# Patient Record
Sex: Male | Born: 1970
Health system: Southern US, Community
[De-identification: ages and names within clinical notes are randomized; demographics above are authoritative.]

## PROBLEM LIST (undated history)

## (undated) DIAGNOSIS — N200 Calculus of kidney: Secondary | ICD-10-CM

## (undated) DIAGNOSIS — E119 Type 2 diabetes mellitus without complications: Secondary | ICD-10-CM

## (undated) HISTORY — DX: Type 2 diabetes mellitus without complications: E11.9

---

## 2007-12-31 ENCOUNTER — Ambulatory Visit (HOSPITAL_COMMUNITY): Admission: RE | Admit: 2007-12-31 | Discharge: 2007-12-31 | Payer: Self-pay | Admitting: Gastroenterology

## 2010-11-25 ENCOUNTER — Emergency Department (HOSPITAL_COMMUNITY)
Admission: EM | Admit: 2010-11-25 | Discharge: 2010-11-25 | Disposition: A | Discharge status: Home / Self Care | Payer: Self-pay | Attending: Emergency Medicine | Admitting: Emergency Medicine

## 2010-11-25 ENCOUNTER — Emergency Department (HOSPITAL_COMMUNITY): Payer: Self-pay

## 2010-11-25 DIAGNOSIS — N200 Calculus of kidney: Secondary | ICD-10-CM | POA: Insufficient documentation

## 2010-11-25 DIAGNOSIS — R1032 Left lower quadrant pain: Secondary | ICD-10-CM | POA: Insufficient documentation

## 2010-11-25 DIAGNOSIS — R109 Unspecified abdominal pain: Secondary | ICD-10-CM | POA: Insufficient documentation

## 2010-11-25 DIAGNOSIS — R112 Nausea with vomiting, unspecified: Secondary | ICD-10-CM | POA: Insufficient documentation

## 2010-11-25 LAB — URINE MICROSCOPIC-ADD ON

## 2010-11-25 LAB — URINALYSIS, ROUTINE W REFLEX MICROSCOPIC
Bilirubin Urine: NEGATIVE
Nitrite: NEGATIVE
Specific Gravity, Urine: 1.023 (ref 1.005–1.030)
pH: 5 (ref 5.0–8.0)

## 2012-02-04 ENCOUNTER — Emergency Department (HOSPITAL_COMMUNITY)
Admission: EM | Admit: 2012-02-04 | Discharge: 2012-02-05 | Disposition: A | Discharge status: Home / Self Care | Payer: Self-pay | Attending: Emergency Medicine | Admitting: Emergency Medicine

## 2012-02-04 DIAGNOSIS — N200 Calculus of kidney: Secondary | ICD-10-CM | POA: Insufficient documentation

## 2012-02-04 LAB — POCT I-STAT, CHEM 8
BUN: 14 mg/dL (ref 6–23)
Chloride: 106 mEq/L (ref 96–112)
Creatinine, Ser: 1.4 mg/dL — ABNORMAL HIGH (ref 0.50–1.35)
Hemoglobin: 17 g/dL (ref 13.0–17.0)
Potassium: 3.4 mEq/L — ABNORMAL LOW (ref 3.5–5.1)
Sodium: 142 mEq/L (ref 135–145)

## 2012-02-04 LAB — URINALYSIS, ROUTINE W REFLEX MICROSCOPIC
Bilirubin Urine: NEGATIVE
pH: 5.5 (ref 5.0–8.0)

## 2012-02-04 LAB — CBC WITH DIFFERENTIAL/PLATELET
Basophils Absolute: 0 10*3/uL (ref 0.0–0.1)
Basophils Relative: 0 % (ref 0–1)
Eosinophils Absolute: 0 10*3/uL (ref 0.0–0.7)
Eosinophils Relative: 1 % (ref 0–5)
Lymphocytes Relative: 14 % (ref 12–46)
MCHC: 34.5 g/dL (ref 30.0–36.0)
MCV: 79 fL (ref 78.0–100.0)
Monocytes Absolute: 0.5 10*3/uL (ref 0.1–1.0)
Platelets: 180 10*3/uL (ref 150–400)
RDW: 13.9 % (ref 11.5–15.5)
WBC: 8.4 10*3/uL (ref 4.0–10.5)

## 2012-02-04 LAB — URINE MICROSCOPIC-ADD ON

## 2012-02-04 MED ORDER — ONDANSETRON HCL 4 MG/2ML IJ SOLN
4.0000 mg | Freq: Once | INTRAMUSCULAR | Status: AC
  Administered 2012-02-04: 4 mg via INTRAVENOUS
  Filled 2012-02-04: qty 2

## 2012-02-04 MED ORDER — TAMSULOSIN HCL 0.4 MG PO CAPS: 0.4000 mg | ORAL_CAPSULE | Freq: Every day | ORAL | Status: DC

## 2012-02-04 MED ORDER — SODIUM CHLORIDE 0.9 % IV BOLUS (SEPSIS)
1000.0000 mL | Freq: Once | INTRAVENOUS | Status: AC
  Administered 2012-02-04: 1000 mL via INTRAVENOUS

## 2012-02-04 MED ORDER — HYDROMORPHONE HCL PF 1 MG/ML IJ SOLN
1.0000 mg | Freq: Once | INTRAMUSCULAR | Status: AC
  Administered 2012-02-04: 1 mg via INTRAVENOUS
  Filled 2012-02-04: qty 1

## 2012-02-04 MED ORDER — OXYCODONE-ACETAMINOPHEN 5-325 MG PO TABS
1.0000 | ORAL_TABLET | Freq: Four times a day (QID) | ORAL | Status: DC | PRN
Start: 2012-02-04 — End: 2015-05-12

## 2012-02-04 MED ORDER — OXYCODONE-ACETAMINOPHEN 5-325 MG PO TABS
2.0000 | ORAL_TABLET | Freq: Once | ORAL | Status: AC
  Administered 2012-02-05: 2 via ORAL
  Filled 2012-02-04: qty 2

## 2012-02-04 NOTE — ED Notes (Signed)
Pt reports hx kidney stone in July.  Pt n/v since 7pm.  Pt present with right side abd pain.  Pt denies chills or fever

## 2012-02-04 NOTE — ED Provider Notes (Addendum)
History     CSN: [id number]  Arrival date & time 02/04/12  2022   First MD Initiated Contact with Patient 02/04/12 2158      Chief Complaint  Patient presents with  . Abdominal Pain    (Consider location/radiation/quality/duration/timing/severity/associated sxs/prior treatment) Patient is a 41 y.o. male presenting with abdominal pain. The history is provided by the patient.  Abdominal Pain The primary symptoms of the illness include abdominal pain, nausea and vomiting. The primary symptoms of the illness do not include fever, diarrhea or dysuria. The current episode started 3 to 5 hours ago. The onset of the illness was sudden. The problem has not changed since onset. The abdominal pain is located in the RLQ. The abdominal pain radiates to the right flank. The severity of the abdominal pain is 9/10. The abdominal pain is relieved by nothing. Exacerbated by: nothing.  The vomiting began today. Vomiting occurs 2 to 5 times per day. The emesis contains stomach contents.  Additional symptoms associated with the illness include anorexia. Symptoms associated with the illness do not include chills, urgency, hematuria or frequency. Associated medical issues comments: hx of kidney stone.    No past medical history on file.  No past surgical history on file.  No family history on file.  History  Substance Use Topics  . Smoking status: Not on file  . Smokeless tobacco: Not on file  . Alcohol Use: Not on file      Review of Systems  Constitutional: Negative for fever and chills.  Gastrointestinal: Positive for nausea, vomiting, abdominal pain and anorexia. Negative for diarrhea.  Genitourinary: Negative for dysuria, urgency, frequency and hematuria.  All other systems reviewed and are negative.    Allergies  Review of patient's allergies indicates no known allergies.  Home Medications   Current Outpatient Rx  Name Route Sig Dispense Refill  . PERCOCET PO Oral Take 2 tablets  by mouth once.      BP 134/83  Temp 98.2 F (36.8 C) (Oral)  Resp 14  SpO2 100%  Physical Exam  Nursing note and vitals reviewed. Constitutional: He is oriented to person, place, and time. He appears well-developed and well-nourished. No distress.       Appears uncomfortable.  HENT:  Head: Normocephalic and atraumatic.  Mouth/Throat: Oropharynx is clear and moist.  Eyes: Conjunctivae normal and EOM are normal. Pupils are equal, round, and reactive to light.  Neck: Normal range of motion. Neck supple.  Cardiovascular: Normal rate, regular rhythm and intact distal pulses.   No murmur heard. Pulmonary/Chest: Effort normal and breath sounds normal. No respiratory distress. He has no wheezes. He has no rales.  Abdominal: Soft. He exhibits no distension. There is tenderness. There is no rebound and no guarding.       Very mild tenderness over the RLQ.  No rebound or guarding  Musculoskeletal: Normal range of motion. He exhibits no edema and no tenderness.  Neurological: He is alert and oriented to person, place, and time.  Skin: Skin is warm and dry. No rash noted. No erythema.  Psychiatric: He has a normal mood and affect. His behavior is normal.    ED Course  Procedures (including critical care time)  Labs Reviewed  CBC WITH DIFFERENTIAL - Abnormal; Notable for the following:    Neutrophils Relative 80 (*)     All other components within normal limits  URINALYSIS, ROUTINE W REFLEX MICROSCOPIC - Abnormal; Notable for the following:    Hgb urine dipstick SMALL (*)  Ketones, ur TRACE (*)     All other components within normal limits  POCT I-STAT, CHEM 8 - Abnormal; Notable for the following:    Potassium 3.4 (*)     Creatinine, Ser 1.40 (*)     Glucose, Bld 141 (*)     Calcium, Ion 1.03 (*)     All other components within normal limits  URINE MICROSCOPIC-ADD ON   No results found.   1. Kidney stone       MDM   Pt with symptoms consistent with kidney stone with hx  of same.  Never required surgical removal.  Denies infectious sx, or GI symptoms.  Low concern for diverticulitis and no risk factors or history suggestive of AAA.  No hx suggestive of GU source (discharge) and otherwise pt is healthy.  Will hydrate, treat pain and ensure no infection with UA, CBC, BMP.  Pt given pain control and nausea control.  11:06 PM Improvement in pain from a 9 to 6/10.  CBC wnl.  I-stat with Cr of 1.4 without old to compare. UA with small hb but o/w unremarkable.  Will attempt to control pain.      Gwyneth Sprout, MD 02/04/12 2322  Gwyneth Sprout, MD 02/04/12 [phone]

## 2014-08-14 ENCOUNTER — Encounter (HOSPITAL_COMMUNITY): Payer: Self-pay | Admitting: Emergency Medicine

## 2014-08-14 ENCOUNTER — Emergency Department (HOSPITAL_COMMUNITY): Payer: Federal, State, Local not specified - PPO

## 2014-08-14 ENCOUNTER — Observation Stay (HOSPITAL_COMMUNITY)
Admission: EM | Admit: 2014-08-14 | Discharge: 2014-08-16 | Disposition: A | Discharge status: Home / Self Care | Payer: Federal, State, Local not specified - PPO | Attending: Internal Medicine | Admitting: Internal Medicine

## 2014-08-14 ENCOUNTER — Emergency Department (INDEPENDENT_AMBULATORY_CARE_PROVIDER_SITE_OTHER)
Admission: EM | Admit: 2014-08-14 | Discharge: 2014-08-14 | Disposition: A | Discharge status: Home / Self Care | Payer: Federal, State, Local not specified - PPO | Source: Home / Self Care | Attending: Family Medicine | Admitting: Family Medicine

## 2014-08-14 DIAGNOSIS — R824 Acetonuria: Secondary | ICD-10-CM | POA: Diagnosis not present

## 2014-08-14 DIAGNOSIS — N289 Disorder of kidney and ureter, unspecified: Secondary | ICD-10-CM

## 2014-08-14 DIAGNOSIS — R81 Glycosuria: Secondary | ICD-10-CM | POA: Diagnosis not present

## 2014-08-14 DIAGNOSIS — I129 Hypertensive chronic kidney disease with stage 1 through stage 4 chronic kidney disease, or unspecified chronic kidney disease: Secondary | ICD-10-CM | POA: Diagnosis not present

## 2014-08-14 DIAGNOSIS — R000 Tachycardia, unspecified: Secondary | ICD-10-CM | POA: Insufficient documentation

## 2014-08-14 DIAGNOSIS — R739 Hyperglycemia, unspecified: Secondary | ICD-10-CM | POA: Diagnosis present

## 2014-08-14 DIAGNOSIS — E119 Type 2 diabetes mellitus without complications: Secondary | ICD-10-CM

## 2014-08-14 DIAGNOSIS — R358 Other polyuria: Secondary | ICD-10-CM

## 2014-08-14 DIAGNOSIS — N182 Chronic kidney disease, stage 2 (mild): Secondary | ICD-10-CM | POA: Diagnosis present

## 2014-08-14 DIAGNOSIS — Z87442 Personal history of urinary calculi: Secondary | ICD-10-CM | POA: Diagnosis not present

## 2014-08-14 DIAGNOSIS — E860 Dehydration: Secondary | ICD-10-CM | POA: Insufficient documentation

## 2014-08-14 DIAGNOSIS — D751 Secondary polycythemia: Secondary | ICD-10-CM

## 2014-08-14 DIAGNOSIS — E1165 Type 2 diabetes mellitus with hyperglycemia: Principal | ICD-10-CM | POA: Insufficient documentation

## 2014-08-14 DIAGNOSIS — I10 Essential (primary) hypertension: Secondary | ICD-10-CM

## 2014-08-14 DIAGNOSIS — R3589 Other polyuria: Secondary | ICD-10-CM

## 2014-08-14 DIAGNOSIS — D45 Polycythemia vera: Secondary | ICD-10-CM | POA: Insufficient documentation

## 2014-08-14 HISTORY — DX: Calculus of kidney: N20.0

## 2014-08-14 LAB — POCT URINALYSIS DIP (DEVICE)
BILIRUBIN URINE: NEGATIVE
Glucose, UA: 500 mg/dL — AB
HGB URINE DIPSTICK: NEGATIVE
KETONES UR: 40 mg/dL — AB
LEUKOCYTES UA: NEGATIVE
Nitrite: NEGATIVE
Protein, ur: NEGATIVE mg/dL
SPECIFIC GRAVITY, URINE: 1.01 (ref 1.005–1.030)
Urobilinogen, UA: 0.2 mg/dL (ref 0.0–1.0)
pH: 5 (ref 5.0–8.0)

## 2014-08-14 LAB — CBC WITH DIFFERENTIAL/PLATELET
BASOS ABS: 0 10*3/uL (ref 0.0–0.1)
Basophils Relative: 0 % (ref 0–1)
Eosinophils Absolute: 0 10*3/uL (ref 0.0–0.7)
Eosinophils Relative: 1 % (ref 0–5)
HEMATOCRIT: 46.6 % (ref 39.0–52.0)
Hemoglobin: 16.1 g/dL (ref 13.0–17.0)
LYMPHS ABS: 1.3 10*3/uL (ref 0.7–4.0)
LYMPHS PCT: 25 % (ref 12–46)
MCH: 26.6 pg (ref 26.0–34.0)
MCHC: 34.5 g/dL (ref 30.0–36.0)
MCV: 77 fL — ABNORMAL LOW (ref 78.0–100.0)
MONOS PCT: 6 % (ref 3–12)
Monocytes Absolute: 0.3 10*3/uL (ref 0.1–1.0)
Neutro Abs: 3.5 10*3/uL (ref 1.7–7.7)
Neutrophils Relative %: 68 % (ref 43–77)
PLATELETS: 183 10*3/uL (ref 150–400)
RBC: 6.05 MIL/uL — ABNORMAL HIGH (ref 4.22–5.81)
RDW: 12.9 % (ref 11.5–15.5)
WBC: 5.2 10*3/uL (ref 4.0–10.5)

## 2014-08-14 LAB — COMPREHENSIVE METABOLIC PANEL
ALBUMIN: 4.3 g/dL (ref 3.5–5.2)
ALK PHOS: 115 U/L (ref 39–117)
ALT: 42 U/L (ref 0–53)
AST: 20 U/L (ref 0–37)
Anion gap: 17 — ABNORMAL HIGH (ref 5–15)
BILIRUBIN TOTAL: 0.8 mg/dL (ref 0.3–1.2)
BUN: 18 mg/dL (ref 6–23)
CHLORIDE: 97 mmol/L (ref 96–112)
CO2: 23 mmol/L (ref 19–32)
CREATININE: 1.51 mg/dL — AB (ref 0.50–1.35)
Calcium: 9 mg/dL (ref 8.4–10.5)
GFR calc Af Amer: 65 mL/min — ABNORMAL LOW (ref >90)
GFR, EST NON AFRICAN AMERICAN: 56 mL/min — AB (ref >90)
Glucose, Bld: 525 mg/dL — ABNORMAL HIGH (ref 70–99)
Potassium: 3.9 mmol/L (ref 3.5–5.1)
Sodium: 137 mmol/L (ref 135–145)
Total Protein: 8.7 g/dL — ABNORMAL HIGH (ref 6.0–8.3)

## 2014-08-14 LAB — POCT I-STAT, CHEM 8
BUN: 23 mg/dL (ref 6–23)
CALCIUM ION: 1.13 mmol/L (ref 1.12–1.23)
CHLORIDE: 99 mmol/L (ref 96–112)
Creatinine, Ser: 1.5 mg/dL — ABNORMAL HIGH (ref 0.50–1.35)
Glucose, Bld: 666 mg/dL (ref 70–99)
HCT: 55 % — ABNORMAL HIGH (ref 39.0–52.0)
HEMOGLOBIN: 18.7 g/dL — AB (ref 13.0–17.0)
Potassium: 4.7 mmol/L (ref 3.5–5.1)
SODIUM: 138 mmol/L (ref 135–145)
TCO2: 24 mmol/L (ref 0–100)

## 2014-08-14 LAB — URINALYSIS, ROUTINE W REFLEX MICROSCOPIC
Bilirubin Urine: NEGATIVE
Hgb urine dipstick: NEGATIVE
Ketones, ur: >80 mg/dL — AB
LEUKOCYTES UA: NEGATIVE
NITRITE: NEGATIVE
Protein, ur: NEGATIVE mg/dL
Specific Gravity, Urine: 1.025 (ref 1.005–1.030)
Urobilinogen, UA: 0.2 mg/dL (ref 0.0–1.0)
pH: 5.5 (ref 5.0–8.0)

## 2014-08-14 LAB — MAGNESIUM: Magnesium: 2 mg/dL (ref 1.5–2.5)

## 2014-08-14 LAB — CBG MONITORING, ED: Glucose-Capillary: 562 mg/dL (ref 70–99)

## 2014-08-14 LAB — URINE MICROSCOPIC-ADD ON

## 2014-08-14 LAB — PHOSPHORUS: PHOSPHORUS: 3.4 mg/dL (ref 2.3–4.6)

## 2014-08-14 LAB — TROPONIN I: Troponin I: <0.03 ng/mL (ref <0.031)

## 2014-08-14 MED ORDER — ENOXAPARIN SODIUM 40 MG/0.4ML ~~LOC~~ SOLN
40.0000 mg | SUBCUTANEOUS | Status: DC
  Administered 2014-08-15 – 2014-08-16 (×2): 40 mg via SUBCUTANEOUS
  Filled 2014-08-14 (×2): qty 0.4

## 2014-08-14 MED ORDER — SODIUM CHLORIDE 0.9 % IV SOLN
INTRAVENOUS | Status: AC
  Administered 2014-08-14: via INTRAVENOUS

## 2014-08-14 MED ORDER — SODIUM CHLORIDE 0.9 % IV SOLN
INTRAVENOUS | Status: DC
  Administered 2014-08-15: 07:00:00 via INTRAVENOUS

## 2014-08-14 MED ORDER — ACETAMINOPHEN 325 MG PO TABS: 650.0000 mg | ORAL_TABLET | Freq: Four times a day (QID) | ORAL | Status: DC | PRN

## 2014-08-14 MED ORDER — INSULIN DETEMIR 100 UNIT/ML ~~LOC~~ SOLN
10.0000 [IU] | Freq: Every day | SUBCUTANEOUS | Status: DC
  Administered 2014-08-15: 10 [IU] via SUBCUTANEOUS
  Filled 2014-08-14 (×2): qty 0.1

## 2014-08-14 MED ORDER — OXYCODONE-ACETAMINOPHEN 5-325 MG PO TABS: 1.0000 | ORAL_TABLET | Freq: Four times a day (QID) | ORAL | Status: DC | PRN

## 2014-08-14 MED ORDER — ACETAMINOPHEN 650 MG RE SUPP: 650.0000 mg | Freq: Four times a day (QID) | RECTAL | Status: DC | PRN

## 2014-08-14 MED ORDER — TAMSULOSIN HCL 0.4 MG PO CAPS
0.4000 mg | ORAL_CAPSULE | Freq: Every day | ORAL | Status: DC
  Administered 2014-08-15: 0.4 mg via ORAL
  Filled 2014-08-14: qty 1

## 2014-08-14 MED ORDER — SODIUM CHLORIDE 0.9 % IV BOLUS (SEPSIS)
2000.0000 mL | Freq: Once | INTRAVENOUS | Status: AC
  Administered 2014-08-14: 2000 mL via INTRAVENOUS

## 2014-08-14 MED ORDER — ONDANSETRON HCL 4 MG/2ML IJ SOLN: 4.0000 mg | Freq: Three times a day (TID) | INTRAMUSCULAR | Status: AC | PRN

## 2014-08-14 MED ORDER — INSULIN ASPART 100 UNIT/ML ~~LOC~~ SOLN
0.0000 [IU] | Freq: Three times a day (TID) | SUBCUTANEOUS | Status: DC
  Administered 2014-08-15: 5 [IU] via SUBCUTANEOUS
  Administered 2014-08-15: 9 [IU] via SUBCUTANEOUS
  Administered 2014-08-15 – 2014-08-16 (×2): 7 [IU] via SUBCUTANEOUS
  Administered 2014-08-16: 3 [IU] via SUBCUTANEOUS

## 2014-08-14 NOTE — ED Provider Notes (Signed)
CSN: [id number]     Arrival date & time 08/14/14  1632 History   First MD Initiated Contact with Patient 08/14/14 1741     Chief Complaint  Patient presents with  . Urinary Frequency   (Consider location/radiation/quality/duration/timing/severity/associated sxs/prior Treatment) HPI Comments: 44 year old male complaining of lightheadedness, polyuria, polydipsia, tired fatigue, dry tongue for 4 days. Denies dysuria.   History reviewed. No pertinent past medical history. History reviewed. No pertinent past surgical history. History reviewed. No pertinent family history. History  Substance Use Topics  . Smoking status: Never Smoker   . Smokeless tobacco: Not on file  . Alcohol Use: No    Review of Systems  Constitutional: Positive for activity change and fatigue. Negative for fever.  HENT: Negative for congestion, ear pain, rhinorrhea and sore throat.   Eyes: Negative.   Respiratory: Negative for cough and shortness of breath.   Cardiovascular: Negative for chest pain and palpitations.  Gastrointestinal: Negative.   Endocrine: Positive for polydipsia and polyuria.  Genitourinary: Positive for frequency. Negative for dysuria.  Skin: Negative.   Neurological: Positive for light-headedness.    Allergies  Review of patient's allergies indicates no known allergies.  Home Medications   Prior to Admission medications   Medication Sig Start Date End Date Taking? Authorizing Provider  Oxycodone-Acetaminophen (PERCOCET PO) Take 2 tablets by mouth once.    Historical Provider, MD  oxyCODONE-acetaminophen (PERCOCET/ROXICET) 5-325 MG per tablet Take 1-2 tablets by mouth every 6 (six) hours as needed for pain. 02/04/12   Blanchie Dessert, MD  Tamsulosin HCl (FLOMAX) 0.4 MG CAPS Take 1 capsule (0.4 mg total) by mouth daily after supper. 02/04/12   Blanchie Dessert, MD   BP 153/101 mmHg  Pulse 117  Temp(Src) 99.4 F (37.4 C) (Oral)  Resp 18  SpO2 98% Physical Exam  Constitutional: He is  oriented to person, place, and time. He appears well-developed and well-nourished. No distress.  HENT:  Mouth/Throat: Oropharynx is clear and moist. No oropharyngeal exudate.  Neck: Normal range of motion. Neck supple.  Cardiovascular: Regular rhythm and normal heart sounds.   tachycardia  Pulmonary/Chest: Effort normal and breath sounds normal. No respiratory distress. He has no wheezes. He has no rales.  Musculoskeletal: He exhibits no edema or tenderness.  Lymphadenopathy:    He has no cervical adenopathy.  Neurological: He is alert and oriented to person, place, and time.  Skin: Skin is warm and dry.  Psychiatric: He has a normal mood and affect.  Nursing note and vitals reviewed.   ED Course  Procedures (including critical care time) Labs Review Labs Reviewed  POCT URINALYSIS DIP (DEVICE) - Abnormal; Notable for the following:    Glucose, UA 500 (*)    Ketones, ur 40 (*)    All other components within normal limits  POCT I-STAT, CHEM 8 - Abnormal; Notable for the following:    Creatinine, Ser 1.50 (*)    Glucose, Bld 666 (*)    Hemoglobin 18.7 (*)    HCT 55.0 (*)    All other components within normal limits   Results for orders placed or performed during the hospital encounter of 08/14/14  POCT urinalysis dip (device)  Result Value Ref Range   Glucose, UA 500 (A) NEGATIVE mg/dL   Bilirubin Urine NEGATIVE NEGATIVE   Ketones, ur 40 (A) NEGATIVE mg/dL   Specific Gravity, Urine 1.010 1.005 - 1.030   Hgb urine dipstick NEGATIVE NEGATIVE   pH 5.0 5.0 - 8.0   Protein, ur NEGATIVE NEGATIVE mg/dL  Urobilinogen, UA 0.2 0.0 - 1.0 mg/dL   Nitrite NEGATIVE NEGATIVE   Leukocytes, UA NEGATIVE NEGATIVE  I-STAT, chem 8  Result Value Ref Range   Sodium 138 135 - 145 mmol/L   Potassium 4.7 3.5 - 5.1 mmol/L   Chloride 99 96 - 112 mmol/L   BUN 23 6 - 23 mg/dL   Creatinine, Ser 1.50 (H) 0.50 - 1.35 mg/dL   Glucose, Bld 666 (HH) 70 - 99 mg/dL   Calcium, Ion 1.13 1.12 - 1.23 mmol/L    TCO2 24 0 - 100 mmol/L   Hemoglobin 18.7 (H) 13.0 - 17.0 g/dL   HCT 55.0 (H) 39.0 - 52.0 %   Comment NOTIFIED PHYSICIAN     Imaging Review No results found.   MDM   1. Newly diagnosed diabetes   2. Polyuria   3. Glycosuria   4. Ketonuria   5. Hyperglycemia    Transfer to the ED for evaluation of newly diagnosed T2DM with hyperglycemia 666mg /dL. Dehydration.    Janne Napoleon, NP 08/14/14 [phone]

## 2014-08-14 NOTE — ED Notes (Signed)
Pt will be transferred over to ED via shuttle. Report called and given to [facility] 1st nurse.

## 2014-08-14 NOTE — ED Notes (Signed)
Pt states that when he drinks a lot of fluid he has to urinate frequently. i asked pt does he urinate frequently if he doesn't drink a lot of fluids pt stated "no"

## 2014-08-14 NOTE — ED Notes (Addendum)
Attempted IV insertion x 1, unsuccessful. Will try again when pt returns from x-ray

## 2014-08-14 NOTE — ED Notes (Signed)
Sent to ED from urgent care with HYPERGLYCEMIA-- new onset-- pt has had polydipsia, polyuria and fatigue x 1-2 weeks. Glucose at urgent care = 500

## 2014-08-14 NOTE — H&P (Signed)
Mark Fry is an 44 y.o. male.    pcp  Unassigned  Chief Complaint: hyperglycemia HPI: 44 yo male with new onset diabetes, c/o weakness, polydipsia, polyuria over the past 2 weeks.  He came into be seen tonight in the ED and was found to have bs >600.  ED requests admission for new onset dm2  Past Medical History  Diagnosis Date  . Nephrolithiasis     History reviewed. No pertinent past surgical history.  No family history on file. Social History:  reports that he has never smoked. He does not have any smokeless tobacco history on file. He reports that he does not drink alcohol or use illicit drugs.  Allergies: No Known Allergies Medications reviewed  (Not in a hospital admission)  Results for orders placed or performed during the hospital encounter of 08/14/14 (from the past 48 hour(s))  CBG monitoring, ED     Status: Abnormal   Collection Time: 08/14/14  7:44 PM  Result Value Ref Range   Glucose-Capillary 562 (HH) 70 - 99 mg/dL  CBC with Differential/Platelet     Status: Abnormal   Collection Time: 08/14/14  8:03 PM  Result Value Ref Range   WBC 5.2 4.0 - 10.5 K/uL   RBC 6.05 (H) 4.22 - 5.81 MIL/uL   Hemoglobin 16.1 13.0 - 17.0 g/dL   HCT 46.6 39.0 - 52.0 %   MCV 77.0 (L) 78.0 - 100.0 fL   MCH 26.6 26.0 - 34.0 pg   MCHC 34.5 30.0 - 36.0 g/dL   RDW 12.9 11.5 - 15.5 %   Platelets 183 150 - 400 K/uL   Neutrophils Relative % 68 43 - 77 %   Neutro Abs 3.5 1.7 - 7.7 K/uL   Lymphocytes Relative 25 12 - 46 %   Lymphs Abs 1.3 0.7 - 4.0 K/uL   Monocytes Relative 6 3 - 12 %   Monocytes Absolute 0.3 0.1 - 1.0 K/uL   Eosinophils Relative 1 0 - 5 %   Eosinophils Absolute 0.0 0.0 - 0.7 K/uL   Basophils Relative 0 0 - 1 %   Basophils Absolute 0.0 0.0 - 0.1 K/uL  Comprehensive metabolic panel     Status: Abnormal   Collection Time: 08/14/14  8:03 PM  Result Value Ref Range   Sodium 137 135 - 145 mmol/L   Potassium 3.9 3.5 - 5.1 mmol/L    Comment: DELTA CHECK NOTED   Chloride  97 96 - 112 mmol/L   CO2 23 19 - 32 mmol/L   Glucose, Bld 525 (H) 70 - 99 mg/dL   BUN 18 6 - 23 mg/dL   Creatinine, Ser 1.51 (H) 0.50 - 1.35 mg/dL   Calcium 9.0 8.4 - 10.5 mg/dL   Total Protein 8.7 (H) 6.0 - 8.3 g/dL   Albumin 4.3 3.5 - 5.2 g/dL   AST 20 0 - 37 U/L   ALT 42 0 - 53 U/L   Alkaline Phosphatase 115 39 - 117 U/L   Total Bilirubin 0.8 0.3 - 1.2 mg/dL   GFR calc non Af Amer 56 (L) >90 mL/min   GFR calc Af Amer 65 (L) >90 mL/min    Comment: (NOTE) The eGFR has been calculated using the CKD EPI equation. This calculation has not been validated in all clinical situations. eGFR's persistently <90 mL/min signify possible Chronic Kidney Disease.    Anion gap 17 (H) 5 - 15   Dg Chest 2 View  08/14/2014   CLINICAL DATA:  Hyperglycemia. Fatigue. Polydipsia.  Polyuria. Glucose was 500. Lightheadedness. Newly diagnosed diabetes.  EXAM: CHEST  2 VIEW  COMPARISON:  11/25/2010  FINDINGS: The heart size and mediastinal contours are within normal limits. Both lungs are clear. The visualized skeletal structures are unremarkable.  IMPRESSION: No active cardiopulmonary disease.   Electronically Signed   By: Van Clines M.D.   On: 08/14/2014 20:44    Review of Systems  Constitutional: Negative for fever, chills, weight loss, malaise/fatigue and diaphoresis.  HENT: Negative.   Eyes: Negative.   Respiratory: Negative.   Cardiovascular: Negative.   Gastrointestinal: Negative.   Genitourinary: Positive for frequency. Negative for dysuria, urgency, hematuria and flank pain.  Musculoskeletal: Negative.   Skin: Negative.   Neurological: Positive for weakness. Negative for dizziness, tingling, tremors, sensory change, speech change, focal weakness, seizures and loss of consciousness.  Endo/Heme/Allergies: Positive for polydipsia. Negative for environmental allergies. Does not bruise/bleed easily.  Psychiatric/Behavioral: Negative.     Blood pressure 138/92, pulse 101, temperature 97.9 F  (36.6 C), temperature source Oral, resp. rate 18, height 6' (1.829 m), weight 99.791 kg (220 lb), SpO2 99 %. Physical Exam  Constitutional: He is oriented to person, place, and time. He appears well-developed and well-nourished.  HENT:  Head: Normocephalic and atraumatic.  Eyes: Conjunctivae and EOM are normal. Pupils are equal, round, and reactive to light. No scleral icterus.  Neck: Normal range of motion. Neck supple. No JVD present. No tracheal deviation present. No thyromegaly present.  Cardiovascular: Normal rate and regular rhythm.  Exam reveals no gallop and no friction rub.   No murmur heard. Respiratory: Effort normal and breath sounds normal. No respiratory distress. He has no wheezes. He has no rales.  GI: Soft. Bowel sounds are normal. He exhibits no distension. There is no tenderness. There is no rebound and no guarding.  Musculoskeletal: Normal range of motion. He exhibits no edema or tenderness.  Lymphadenopathy:    He has no cervical adenopathy.  Neurological: He is alert and oriented to person, place, and time. He has normal reflexes. He displays normal reflexes. No cranial nerve deficit. He exhibits normal muscle tone. Coordination normal.  Skin: Skin is warm and dry. No rash noted. No erythema. No pallor.  Psychiatric: He has a normal mood and affect. His behavior is normal. Judgment and thought content normal.     Assessment/Plan Hyperglycemia, new onset diabetes Ns iv hga1c  Polycythemia Repeat cbc in am  CKD stage 2 Check urine sodium, urine eosinophils, urine creatinine Check renal ultrasound Check spep, upep  Hypertension Start on carvedilol   Tachycardia Trop i q6h x3,  Check tsh Consider cardiac echo if persistent  DVT prophylaxis scd     Jani Gravel 08/14/2014, 10:04 PM

## 2014-08-14 NOTE — ED Notes (Addendum)
1 of 2 boluses complete at this time.

## 2014-08-15 ENCOUNTER — Observation Stay (HOSPITAL_COMMUNITY): Payer: Federal, State, Local not specified - PPO

## 2014-08-15 DIAGNOSIS — R739 Hyperglycemia, unspecified: Secondary | ICD-10-CM

## 2014-08-15 DIAGNOSIS — N179 Acute kidney failure, unspecified: Secondary | ICD-10-CM

## 2014-08-15 DIAGNOSIS — E119 Type 2 diabetes mellitus without complications: Secondary | ICD-10-CM

## 2014-08-15 LAB — COMPREHENSIVE METABOLIC PANEL
ALBUMIN: 3.3 g/dL — AB (ref 3.5–5.2)
ALT: 30 U/L (ref 0–53)
AST: 15 U/L (ref 0–37)
Alkaline Phosphatase: 81 U/L (ref 39–117)
Anion gap: 10 (ref 5–15)
BILIRUBIN TOTAL: 0.6 mg/dL (ref 0.3–1.2)
BUN: 18 mg/dL (ref 6–23)
CHLORIDE: 104 mmol/L (ref 96–112)
CO2: 25 mmol/L (ref 19–32)
CREATININE: 1.22 mg/dL (ref 0.50–1.35)
Calcium: 8.6 mg/dL (ref 8.4–10.5)
GFR calc Af Amer: 84 mL/min — ABNORMAL LOW (ref >90)
GFR, EST NON AFRICAN AMERICAN: 72 mL/min — AB (ref >90)
Glucose, Bld: 409 mg/dL — ABNORMAL HIGH (ref 70–99)
Potassium: 4.1 mmol/L (ref 3.5–5.1)
Sodium: 139 mmol/L (ref 135–145)
Total Protein: 5.9 g/dL — ABNORMAL LOW (ref 6.0–8.3)

## 2014-08-15 LAB — CBC
HEMATOCRIT: 40.5 % (ref 39.0–52.0)
Hemoglobin: 13.7 g/dL (ref 13.0–17.0)
MCH: 26.6 pg (ref 26.0–34.0)
MCHC: 33.8 g/dL (ref 30.0–36.0)
MCV: 78.5 fL (ref 78.0–100.0)
PLATELETS: 162 10*3/uL (ref 150–400)
RBC: 5.16 MIL/uL (ref 4.22–5.81)
RDW: 13.2 % (ref 11.5–15.5)
WBC: 5.4 10*3/uL (ref 4.0–10.5)

## 2014-08-15 LAB — GLUCOSE, CAPILLARY
GLUCOSE-CAPILLARY: 363 mg/dL — AB (ref 70–99)
GLUCOSE-CAPILLARY: 422 mg/dL — AB (ref 70–99)
Glucose-Capillary: 410 mg/dL — ABNORMAL HIGH (ref 70–99)
Glucose-Capillary: 313 mg/dL — ABNORMAL HIGH (ref 70–99)
Glucose-Capillary: 300 mg/dL — ABNORMAL HIGH (ref 70–99)
Glucose-Capillary: 397 mg/dL — ABNORMAL HIGH (ref 70–99)
Glucose-Capillary: 367 mg/dL — ABNORMAL HIGH (ref 70–99)

## 2014-08-15 LAB — TSH: TSH: 1.594 u[IU]/mL (ref 0.350–4.500)

## 2014-08-15 LAB — HEMOGLOBIN A1C
HEMOGLOBIN A1C: 12.1 % — AB (ref 4.8–5.6)
MEAN PLASMA GLUCOSE: 301 mg/dL

## 2014-08-15 LAB — TROPONIN I
Troponin I: <0.03 ng/mL (ref <0.031)
Troponin I: <0.03 ng/mL (ref <0.031)

## 2014-08-15 LAB — CALCIUM / CREATININE RATIO, URINE
Calcium, Ur: 4 mg/dL
Calcium/Creat.Ratio: 0.1
Creatinine, Urine: 60.5 mg/dL

## 2014-08-15 LAB — SODIUM, URINE, RANDOM: SODIUM UR: 83 mmol/L

## 2014-08-15 MED ORDER — INSULIN STARTER KIT- SYRINGES (ENGLISH)
1.0000 | Freq: Once | Status: AC
  Administered 2014-08-15: 1
  Filled 2014-08-15: qty 1

## 2014-08-15 MED ORDER — LIVING WELL WITH DIABETES BOOK
Freq: Once | Status: AC
  Administered 2014-08-15: 12:00:00
  Filled 2014-08-15: qty 1

## 2014-08-15 MED ORDER — INSULIN DETEMIR 100 UNIT/ML ~~LOC~~ SOLN
20.0000 [IU] | Freq: Two times a day (BID) | SUBCUTANEOUS | Status: DC
  Filled 2014-08-15: qty 0.2

## 2014-08-15 MED ORDER — INSULIN DETEMIR 100 UNIT/ML ~~LOC~~ SOLN
20.0000 [IU] | Freq: Every day | SUBCUTANEOUS | Status: DC
  Administered 2014-08-15: 20 [IU] via SUBCUTANEOUS
  Filled 2014-08-15 (×2): qty 0.2

## 2014-08-15 MED ORDER — INSULIN DETEMIR 100 UNIT/ML ~~LOC~~ SOLN: 30.0000 [IU] | Freq: Every day | SUBCUTANEOUS | Status: DC

## 2014-08-15 NOTE — Progress Notes (Addendum)
PROGRESS NOTE  Mark Fry IHK:[id number] DOB: Aug 08, 1970 DOA: 08/14/2014 PCP: No primary care provider on file.  Assessment/Plan: Hyperglycemia, new onset diabetes -lantus 10 units given -will increase to 20 units as blood sugars still 400s- if [facility] can be [facility] at [facility], can be d/c'd on this, otherwise may need to be switched to 70/30 -was never started on insulin gtt hga1c  Polycythemia Hgb normal range  CKD stage 2 Cr stable  Hypertension Start on carvedilol   Tachycardia Resolved with IVF  Code Status: full Family Communication: patient Disposition Plan:   Consultants:  Diabetic coordinator  Procedures:     HPI/Subjective: No complaints   Objective: Filed Vitals:   08/15/14 0614  BP: 132/93  Pulse: 78  Temp: 98.5 F (36.9 C)  Resp: 17    Intake/Output Summary (Last 24 hours) at 08/15/14 0849 Last data filed at 08/15/14 0500  Gross per 24 hour  Intake   1759 ml  Output      0 ml  Net   1759 ml   Filed Weights   08/14/14 1911 08/14/14 2247 08/15/14 0614  Weight: 99.791 kg (220 lb) 90.447 kg (199 lb 6.4 oz) 95.437 kg (210 lb 6.4 oz)    Exam:   General:  A+Ox3, NAD  Cardiovascular: rrr  Respiratory: clear  Abdomen: +BS, soft  Musculoskeletal: no edema   Data Reviewed: Basic Metabolic Panel:  Recent Labs Lab 08/14/14 1821 08/14/14 2003 08/14/14 2301 08/15/14 0435  NA 138 137  --  139  K 4.7 3.9  --  4.1  CL 99 97  --  104  CO2  --  23  --  25  GLUCOSE 666* 525*  --  409*  BUN 23 18  --  18  CREATININE 1.50* 1.51*  --  1.22  CALCIUM  --  9.0  --  8.6  MG  --   --  2.0  --   PHOS  --   --  3.4  --    Liver Function Tests:  Recent Labs Lab 08/14/14 2003 08/15/14 0435  AST 20 15  ALT 42 30  ALKPHOS 115 81  BILITOT 0.8 0.6  PROT 8.7* 5.9*  ALBUMIN 4.3 3.3*   No results for input(s): LIPASE, AMYLASE in the last 168 hours. No results for input(s): AMMONIA in the last 168 hours. CBC:  Recent  Labs Lab 08/14/14 1821 08/14/14 2003 08/15/14 0435  WBC  --  5.2 5.4  NEUTROABS  --  3.5  --   HGB 18.7* 16.1 13.7  HCT 55.0* 46.6 40.5  MCV  --  77.0* 78.5  PLT  --  183 162   Cardiac Enzymes:  Recent Labs Lab 08/14/14 2301 08/15/14 0435  TROPONINI <0.03 <0.03   BNP (last 3 results) No results for input(s): BNP in the last 8760 hours.  ProBNP (last 3 results) No results for input(s): PROBNP in the last 8760 hours.  CBG:  Recent Labs Lab 08/14/14 1944 08/15/14 0127 08/15/14 0404 08/15/14 0744  GLUCAP 562* 363* 422* 410*    No results found for this or any previous visit (from the past 240 hour(s)).   Studies: Dg Chest 2 View  08/14/2014   CLINICAL DATA:  Hyperglycemia. Fatigue. Polydipsia. Polyuria. Glucose was 500. Lightheadedness. Newly diagnosed diabetes.  EXAM: CHEST  2 VIEW  COMPARISON:  11/25/2010  FINDINGS: The heart size and mediastinal contours are within normal limits. Both lungs are clear. The visualized skeletal structures are unremarkable.  IMPRESSION: No  active cardiopulmonary disease.   Electronically Signed   By: Van Clines M.D.   On: 08/14/2014 20:44   US Renal  08/15/2014   CLINICAL DATA:  Renal insufficiency  EXAM: RENAL/URINARY TRACT ULTRASOUND COMPLETE  COMPARISON:  CT abdomen and pelvis November 25, 2010  FINDINGS: Right Kidney:  Length: 9.8 cm. Echogenicity and renal cortical thickness are within normal limits. No perinephric fluid or hydronephrosis visualized. There is a cyst arising from the upper pole of the right kidney measuring the 2.3 x 2.2 x 2.3 cm. No sonographically demonstrable calculus or ureterectasis.  Left Kidney:  Length: 11.8 cm. Echogenicity and renal cortical thickness are within normal limits. No mass, perinephric fluid, or hydronephrosis visualized. No sonographically demonstrable calculus or ureterectasis.  Bladder:  Appears normal for degree of bladder distention.  IMPRESSION: Cyst upper pole right kidney. No obstructing  foci. Borderline size discrepancy between kidneys. This finding potentially could be indicative of renal artery stenosis on the right. In this regard, question whether patient is hypertensive.   Electronically Signed   By: Lowella Grip III M.D.   On: 08/15/2014 07:12    Scheduled Meds: . sodium chloride   Intravenous STAT  . enoxaparin (LOVENOX) injection  40 mg Subcutaneous Q24H  . insulin aspart  0-9 Units Subcutaneous TID WC  . insulin detemir  20 Units Subcutaneous Daily  . tamsulosin  0.4 mg Oral QPC supper   Continuous Infusions: . sodium chloride 100 mL/hr at 08/15/14 0719   Antibiotics Given (last 72 hours)    None      Active Problems:   Hyperglycemia   Hypertension   Polycythemia   CKD (chronic kidney disease), stage II   Diabetes mellitus, new onset    Time spent: 25 min    Calee Nugent  [facility] Pager [phone]. If 7PM-7AM, please contact night-coverage at [email or web address], password [facility] 08/15/2014, 8:49 AM

## 2014-08-15 NOTE — Care Management Note (Signed)
  Page 1 of 1   08/15/2014     10:42:18 AM CARE MANAGEMENT NOTE 08/15/2014  Patient:  MADISON, ALBEA   Account Number:  [id number]  Date Initiated:  08/15/2014  Documentation initiated by:  Magdalen Spatz  Subjective/Objective Assessment:     Action/Plan:   Anticipated DC Date:  08/15/2014   Anticipated DC Plan:  HOME/SELF CARE         Choice offered to / List presented to:             Status of service:   Medicare Important Message given?   (If response is "NO", the following Medicare IM given date fields will be blank) Date Medicare IM given:   Medicare IM given by:   Date Additional Medicare IM given:   Additional Medicare IM given by:    Discharge Disposition:    Per UR Regulation:    If discussed at Long Length of Stay Meetings, dates discussed:    Comments:  08-15-14 Spoke to patient regarding PCP. Confirmed he has [facility] . Explained to patient that he can call phone number on [facility] and be provided with a list of MD's in network , [facility] number given to patient , and if he already has a MD in mind he can call MD office directly to see if they accepting new patients with [facility]. Also provided patient with [facility] and [facility] information. Patient voiced understanding . Magdalen Spatz RN BSN

## 2014-08-15 NOTE — Plan of Care (Signed)
Problem: Food- and Nutrition-Related Knowledge Deficit (NB-1.1) Goal: Nutrition education Formal process to instruct or train a patient/client in a skill or to impart knowledge to help patients/clients voluntarily manage or modify food choices and eating behavior to maintain or improve health. Outcome: Adequate for Discharge  RD consulted for nutrition education regarding diabetes.     Lab Results  Component Value Date    HGBA1C 12.1* 08/14/2014    RD provided "Carbohydrate Counting for People with Diabetes" handout from the Academy of Nutrition and Dietetics. Discussed different food groups and their effects on blood sugar, emphasizing carbohydrate-containing foods. Provided list of carbohydrates and recommended serving sizes of common foods.  Discussed importance of controlled and consistent carbohydrate intake throughout the day. Provided examples of ways to balance meals/snacks and encouraged intake of high-fiber, whole grain complex carbohydrates. Teach back method used.  Expect fair to good compliance.  Body mass index is 28.53 kg/(m^2). Pt meets criteria for overweight based on current BMI.  Current diet order is carb modified, patient is consuming approximately 100% of meals at this time. Labs and medications reviewed. No further nutrition interventions warranted at this time. RD contact information provided. If additional nutrition issues arise, please re-consult RD.  Yitta Gongaware A. Jimmye Norman, RD, LDN, CDE Pager: [phone] After hours Pager: [phone]

## 2014-08-15 NOTE — Progress Notes (Signed)
UR completed 

## 2014-08-15 NOTE — Progress Notes (Signed)
Inpatient Diabetes Program Recommendations  AACE/ADA: New Consensus Statement on Inpatient Glycemic Control (2013)  Target Ranges:  Prepandial:   less than 140 mg/dL      Peak postprandial:   less than 180 mg/dL (1-2 hours)      Critically ill patients:  140 - 180 mg/dL   Reason for Assessment: Results for Mark Fry, Mark Fry (MRN [id number]) as of 08/15/2014 15:02  Ref. Range 08/14/2014 19:44 08/15/2014 01:27 08/15/2014 04:04 08/15/2014 07:44 08/15/2014 12:30  Glucose-Capillary Latest Range: 70-99 mg/dL 562 (HH) 363 (H) 422 (H) 410 (H) 313 (H)   New Onset Diabetes.  Patient states he has no family history of diabetes.  He has had symptoms of hyperglycemia for several weeks including frequent urination, thirst and weakness.  Spoke with patient and wife regarding new diabetes diagnosis.  He has spoke with case manager regarding getting a PCP.  Discussed hyperglycemia, hypoglycemia, treatment, foot care, insulin types and injection sites.  Also discussed and demonstrated insulin pen and allowed patient to return demonstration including 2 unit prime, dialing dose and holding for 6-10 seconds.  Left patient Levemir coupon and pamphlet that also describes insulin pen use.  Briefly discussed results of A1C and goal A1C.  Reviewed "Living Well with Diabetes" booklet with patient and wife.   He has watched some of the videos but states he fell asleep.  States he will re-watch.  He is interested in attending "Outpatient Diabetes Education".  Will order per protocol.    Likely will also benefit from the addition of Metformin 500 mg bid.  May also need Novolog meal coverage.  Please order insulin pen/Levemir Flexpen, insulin pen needles, and Blood glucose meter kit.  Thanks, Adah Perl, RN, BC-ADM Inpatient Diabetes Coordinator Pager [phone])

## 2014-08-16 DIAGNOSIS — I10 Essential (primary) hypertension: Secondary | ICD-10-CM

## 2014-08-16 LAB — GLUCOSE, CAPILLARY
Glucose-Capillary: 250 mg/dL — ABNORMAL HIGH (ref 70–99)
Glucose-Capillary: 341 mg/dL — ABNORMAL HIGH (ref 70–99)

## 2014-08-16 LAB — PROTEIN ELECTROPHORESIS, SERUM
A/G Ratio: 1 (ref 0.7–2.0)
ALPHA-1-GLOBULIN: 0.2 g/dL (ref 0.1–0.4)
ALPHA-2-GLOBULIN: 0.6 g/dL (ref 0.4–1.2)
Albumin ELP: 3.4 g/dL (ref 3.2–5.6)
Beta Globulin: 1.5 g/dL — ABNORMAL HIGH (ref 0.6–1.3)
GAMMA GLOBULIN: 1.2 g/dL (ref 0.5–1.6)
GLOBULIN, TOTAL: 3.5 g/dL (ref 2.0–4.5)
M-SPIKE, %: 0.4 g/dL — AB
Total Protein ELP: 6.9 g/dL (ref 6.0–8.5)

## 2014-08-16 MED ORDER — INSULIN DETEMIR 100 UNIT/ML ~~LOC~~ SOLN: 25.0000 [IU] | Freq: Every day | SUBCUTANEOUS | Status: DC

## 2014-08-16 MED ORDER — METFORMIN HCL 500 MG PO TABS: 500.0000 mg | ORAL_TABLET | Freq: Two times a day (BID) | ORAL | Status: DC

## 2014-08-16 MED ORDER — BLOOD GLUCOSE MONITOR KIT: PACK | Status: DC

## 2014-08-16 MED ORDER — INSULIN DETEMIR 100 UNIT/ML FLEXPEN: 25.0000 [IU] | PEN_INJECTOR | Freq: Every day | SUBCUTANEOUS | Status: DC

## 2014-08-16 NOTE — Progress Notes (Signed)
D/c to home with sister via wheelchar. D/c instructions and education materials along with prescriptions given and demonstrated understanding. VSS pain denied

## 2014-08-16 NOTE — Discharge Summary (Signed)
Physician Discharge Summary  Mark Fry TGG:[id number] DOB: 1970/10/04 DOA: 08/14/2014  PCP: No primary care provider on file.  Admit date: 08/14/2014 Discharge date: 08/16/2014  Time spent: 35 minutes  Recommendations for Outpatient Follow-up:  1. Will need further titration of levemir and possible addition of novolog with meals 2. Patient to keep record and bring to PCP   Discharge Diagnoses:  Active Problems:   Hyperglycemia   Hypertension   Polycythemia   CKD (chronic kidney disease), stage II   Diabetes mellitus, new onset   Discharge Condition: improved  Diet recommendation: carbmod  Filed Weights   08/14/14 2247 08/15/14 0614 08/16/14 0618  Weight: 90.447 kg (199 lb 6.4 oz) 95.437 kg (210 lb 6.4 oz) 95.528 kg (210 lb 9.6 oz)    History of present illness:  44 yo male with new onset diabetes, c/o weakness, polydipsia, polyuria over the past 2 weeks. He came into be seen tonight in the ED and was found to have bs >600. ED requests admission for new onset dm2  Hospital Course:  Hyperglycemia, new onset diabetes -increase levemir Add metformin -was never started on insulin gtt hga1c 12.1  Polycythemia- from dehydration Hgb normal range  CKD stage 2 Cr stable  Hypertension stable  Tachycardia Resolved with IVF  Procedures:    Consultations:  Diabetic coordinator  Discharge Exam: Filed Vitals:   08/16/14 0618  BP: 135/79  Pulse: 80  Temp: 96.7 F (35.9 C)  Resp: 18    General: A+Ox3, NAD Cardiovascular: rrr Respiratory: clear  Discharge Instructions   Discharge Instructions    Ambulatory referral to Nutrition and Diabetic Education    Complete by:  As directed      Diet - low sodium heart healthy    Complete by:  As directed   And diabetic diet     Discharge instructions    Complete by:  As directed   Be sure to bring blood sugar log to PCP -check fasting in AM and then 2 hours after a meal daily, bring to PCP     Increase activity  slowly    Complete by:  As directed           Current Discharge Medication List    START taking these medications   Details  blood glucose meter kit and supplies KIT Dispense based on patient and insurance preference. Use up to four times daily as directed. (FOR ICD-9 250.00, 250.01). Qty: 1 each, Refills: 0    Insulin Detemir (LEVEMIR) 100 UNIT/ML Pen Inject 25 Units into the skin daily at 10 pm. Qty: 15 mL, Refills: 11    metFORMIN (GLUCOPHAGE) 500 MG tablet Take 1 tablet (500 mg total) by mouth 2 (two) times daily with a meal. Qty: 60 tablet, Refills: 0      CONTINUE these medications which have NOT CHANGED   Details  oxyCODONE-acetaminophen (PERCOCET/ROXICET) 5-325 MG per tablet Take 1-2 tablets by mouth every 6 (six) hours as needed for pain. Qty: 20 tablet, Refills: 0    Tamsulosin HCl (FLOMAX) 0.4 MG CAPS Take 1 capsule (0.4 mg total) by mouth daily after supper. Qty: 5 capsule, Refills: 0       No Known Allergies Follow-up Information    Please follow up.   Why:  patient to arrange PCP as he has insurance       The results of significant diagnostics from this hospitalization (including imaging, microbiology, ancillary and laboratory) are listed below for reference.    Significant Diagnostic Studies: Dg  Chest 2 View  08/14/2014   CLINICAL DATA:  Hyperglycemia. Fatigue. Polydipsia. Polyuria. Glucose was 500. Lightheadedness. Newly diagnosed diabetes.  EXAM: CHEST  2 VIEW  COMPARISON:  11/25/2010  FINDINGS: The heart size and mediastinal contours are within normal limits. Both lungs are clear. The visualized skeletal structures are unremarkable.  IMPRESSION: No active cardiopulmonary disease.   Electronically Signed   By: Van Clines M.D.   On: 08/14/2014 20:44   US Renal  08/15/2014   CLINICAL DATA:  Renal insufficiency  EXAM: RENAL/URINARY TRACT ULTRASOUND COMPLETE  COMPARISON:  CT abdomen and pelvis November 25, 2010  FINDINGS: Right Kidney:  Length: 9.8 cm.  Echogenicity and renal cortical thickness are within normal limits. No perinephric fluid or hydronephrosis visualized. There is a cyst arising from the upper pole of the right kidney measuring the 2.3 x 2.2 x 2.3 cm. No sonographically demonstrable calculus or ureterectasis.  Left Kidney:  Length: 11.8 cm. Echogenicity and renal cortical thickness are within normal limits. No mass, perinephric fluid, or hydronephrosis visualized. No sonographically demonstrable calculus or ureterectasis.  Bladder:  Appears normal for degree of bladder distention.  IMPRESSION: Cyst upper pole right kidney. No obstructing foci. Borderline size discrepancy between kidneys. This finding potentially could be indicative of renal artery stenosis on the right. In this regard, question whether patient is hypertensive.   Electronically Signed   By: Lowella Grip III M.D.   On: 08/15/2014 07:12    Microbiology: No results found for this or any previous visit (from the past 240 hour(s)).   Labs: Basic Metabolic Panel:  Recent Labs Lab 08/14/14 1821 08/14/14 2003 08/14/14 2301 08/15/14 0435  NA 138 137  --  139  K 4.7 3.9  --  4.1  CL 99 97  --  104  CO2  --  23  --  25  GLUCOSE 666* 525*  --  409*  BUN 23 18  --  18  CREATININE 1.50* 1.51*  --  1.22  CALCIUM  --  9.0  --  8.6  MG  --   --  2.0  --   PHOS  --   --  3.4  --    Liver Function Tests:  Recent Labs Lab 08/14/14 2003 08/15/14 0435  AST 20 15  ALT 42 30  ALKPHOS 115 81  BILITOT 0.8 0.6  PROT 8.7* 5.9*  ALBUMIN 4.3 3.3*   No results for input(s): LIPASE, AMYLASE in the last 168 hours. No results for input(s): AMMONIA in the last 168 hours. CBC:  Recent Labs Lab 08/14/14 1821 08/14/14 2003 08/15/14 0435  WBC  --  5.2 5.4  NEUTROABS  --  3.5  --   HGB 18.7* 16.1 13.7  HCT 55.0* 46.6 40.5  MCV  --  77.0* 78.5  PLT  --  183 162   Cardiac Enzymes:  Recent Labs Lab 08/14/14 2301 08/15/14 0435 08/15/14 1015  TROPONINI <0.03  <0.03 <0.03   BNP: BNP (last 3 results) No results for input(s): BNP in the last 8760 hours.  ProBNP (last 3 results) No results for input(s): PROBNP in the last 8760 hours.  CBG:  Recent Labs Lab 08/15/14 1230 08/15/14 1728 08/15/14 1938 08/15/14 2132 08/16/14 0752  GLUCAP 313* 300* 397* 367* 250*       Signed:  Eliseo Squires, Ronita Hargreaves  [facility] 08/16/2014, 12:16 PM

## 2014-08-17 LAB — UIFE/LIGHT CHAINS/TP QN, 24-HR UR
Albumin, U: DETECTED
Alpha 1, Urine: DETECTED — AB
Alpha 2, Urine: DETECTED — AB
Beta, Urine: DETECTED — AB
Gamma Globulin, Urine: DETECTED — AB
TOTAL PROTEIN, URINE-UPE24: 4 mg/dL — AB (ref 5–25)

## 2014-09-03 NOTE — ED Provider Notes (Signed)
CSN: [id number]     Arrival date & time 08/14/14  1855 History   First MD Initiated Contact with Patient 08/14/14 1940     Chief Complaint  Patient presents with  . Hyperglycemia     HPI Patient presents emergency room from urgent care and was found to have hyperglycemia.  New-onset also has polydipsia. Past Medical History  Diagnosis Date  . Nephrolithiasis    History reviewed. No pertinent past surgical history. History reviewed. No pertinent family history. History  Substance Use Topics  . Smoking status: Never Smoker   . Smokeless tobacco: Not on file  . Alcohol Use: No    Review of Systems    Allergies  Review of patient's allergies indicates no known allergies.  Home Medications   Prior to Admission medications   Medication Sig Start Date End Date Taking? Authorizing Provider  blood glucose meter kit and supplies KIT Dispense based on patient and insurance preference. Use up to four times daily as directed. (FOR ICD-9 250.00, 250.01). 08/16/14   Geradine Girt, DO  Insulin Detemir (LEVEMIR) 100 UNIT/ML Pen Inject 25 Units into the skin daily at 10 pm. 08/16/14   Geradine Girt, DO  metFORMIN (GLUCOPHAGE) 500 MG tablet Take 1 tablet (500 mg total) by mouth 2 (two) times daily with a meal. 08/16/14   Geradine Girt, DO  oxyCODONE-acetaminophen (PERCOCET/ROXICET) 5-325 MG per tablet Take 1-2 tablets by mouth every 6 (six) hours as needed for pain. 02/04/12   Blanchie Dessert, MD  Tamsulosin HCl (FLOMAX) 0.4 MG CAPS Take 1 capsule (0.4 mg total) by mouth daily after supper. 02/04/12   Blanchie Dessert, MD   BP 157/98 mmHg  Pulse 84  Temp(Src) 97.9 F (36.6 C) (Oral)  Resp 18  Ht 6' 0.01" (1.829 m)  Wt 210 lb 9.6 oz (95.528 kg)  BMI 28.56 kg/m2  SpO2 99% Physical Exam Physical Exam  Nursing note and vitals reviewed. Constitutional: He is oriented to person, place, and time. He appears well-developed and well-nourished. No distress.  HENT:  Head: Normocephalic and  atraumatic.  Eyes: Pupils are equal, round, and reactive to light.  Neck: Normal range of motion.  Cardiovascular: Tachycardia and intact distal pulses.   Pulmonary/Chest: No respiratory distress.  Abdominal: Normal appearance. He exhibits no distension.  Musculoskeletal: Normal range of motion.  Neurological: He is alert and oriented to person, place, and time. No cranial nerve deficit.  Skin: Skin is warm and dry. No rash noted.  Psychiatric: He has a normal mood and affect. His behavior is normal.   ED Course  Procedures (including critical care time) Medications  ondansetron (ZOFRAN) injection 4 mg (not administered)  sodium chloride 0.9 % bolus 2,000 mL (2,000 mLs Intravenous Transfusing/Transfer 08/14/14 2230)  0.9 %  sodium chloride infusion ( Intravenous New Bag/Given 08/14/14 2350)  living well with diabetes book MISC ( Does not apply Given 08/15/14 1146)  insulin starter kit- syringes (English) 1 kit (1 kit Other Given 08/15/14 1106)    Labs Review Labs Reviewed  CBC WITH DIFFERENTIAL/PLATELET - Abnormal; Notable for the following:    RBC 6.05 (*)    MCV 77.0 (*)    All other components within normal limits  COMPREHENSIVE METABOLIC PANEL - Abnormal; Notable for the following:    Glucose, Bld 525 (*)    Creatinine, Ser 1.51 (*)    Total Protein 8.7 (*)    GFR calc non Af Amer 56 (*)    GFR calc Af Amer 65 (*)  Anion gap 17 (*)    All other components within normal limits  HEMOGLOBIN A1C - Abnormal; Notable for the following:    Hgb A1c MFr Bld 12.1 (*)    All other components within normal limits  URINALYSIS, ROUTINE W REFLEX MICROSCOPIC - Abnormal; Notable for the following:    Glucose, UA >1000 (*)    Ketones, ur >80 (*)    All other components within normal limits  PROTEIN ELECTROPHORESIS, SERUM - Abnormal; Notable for the following:    Beta Globulin 1.5 (*)    M-Spike, % 0.4 (*)    All other components within normal limits  IMMUNOFIXATION ELECTROPHORESIS, URINE  (WITH TOT PROT) - Abnormal; Notable for the following:    Total Protein, Urine 4 (*)    Alpha 1, Urine DETECTED (*)    Alpha 2, Urine DETECTED (*)    Beta, Urine DETECTED (*)    Gamma Globulin, Urine DETECTED (*)    All other components within normal limits  COMPREHENSIVE METABOLIC PANEL - Abnormal; Notable for the following:    Glucose, Bld 409 (*)    Total Protein 5.9 (*)    Albumin 3.3 (*)    GFR calc non Af Amer 72 (*)    GFR calc Af Amer 84 (*)    All other components within normal limits  GLUCOSE, CAPILLARY - Abnormal; Notable for the following:    Glucose-Capillary 363 (*)    All other components within normal limits  GLUCOSE, CAPILLARY - Abnormal; Notable for the following:    Glucose-Capillary 422 (*)    All other components within normal limits  GLUCOSE, CAPILLARY - Abnormal; Notable for the following:    Glucose-Capillary 410 (*)    All other components within normal limits  GLUCOSE, CAPILLARY - Abnormal; Notable for the following:    Glucose-Capillary 313 (*)    All other components within normal limits  GLUCOSE, CAPILLARY - Abnormal; Notable for the following:    Glucose-Capillary 300 (*)    All other components within normal limits  GLUCOSE, CAPILLARY - Abnormal; Notable for the following:    Glucose-Capillary 397 (*)    All other components within normal limits  GLUCOSE, CAPILLARY - Abnormal; Notable for the following:    Glucose-Capillary 367 (*)    All other components within normal limits  GLUCOSE, CAPILLARY - Abnormal; Notable for the following:    Glucose-Capillary 250 (*)    All other components within normal limits  GLUCOSE, CAPILLARY - Abnormal; Notable for the following:    Glucose-Capillary 341 (*)    All other components within normal limits  CBG MONITORING, ED - Abnormal; Notable for the following:    Glucose-Capillary 562 (*)    All other components within normal limits  TROPONIN I  TROPONIN I  TROPONIN I  TSH  URINE MICROSCOPIC-ADD ON   SODIUM, URINE, RANDOM  CALCIUM / CREATININE RATIO, URINE  MAGNESIUM  PHOSPHORUS  CBC  CYTOLOGY - NON PAP    Imaging Review No results found.     DG Chest 2 View (Final result) Result time: 08/14/14 20:44:08   Final result by Rad Results In Interface (08/14/14 20:44:08)   Narrative:   CLINICAL DATA: Hyperglycemia. Fatigue. Polydipsia. Polyuria. Glucose was 500. Lightheadedness. Newly diagnosed diabetes.  EXAM: CHEST 2 VIEW  COMPARISON: 11/25/2010  FINDINGS: The heart size and mediastinal contours are within normal limits. Both lungs are clear. The visualized skeletal structures are unremarkable.  IMPRESSION: No active cardiopulmonary disease.   Electronically Signed By: Van Clines  M.D. On: 08/14/2014 20:44   CRITICAL CARE Performed by: Dot Lanes Total critical care time: 30 min Critical care time was exclusive of separately billable procedures and treating other patients. Critical care was necessary to treat or prevent imminent or life-threatening deterioration. Critical care was time spent personally by me on the following activities: development of treatment plan with patient and/or surrogate as well as nursing, discussions with consultants, evaluation of patient's response to treatment, examination of patient, obtaining history from patient or surrogate, ordering and performing treatments and interventions, ordering and review of laboratory studies, ordering and review of radiographic studies, pulse oximetry and re-evaluation of patient's condition.   MDM   Final diagnoses:  Hyperglycemia  Diabetes mellitus, new onset        Leonard Schwartz, MD 09/03/14 [phone]

## 2014-09-06 ENCOUNTER — Encounter: Payer: Federal, State, Local not specified - PPO | Attending: Podiatry | Admitting: Dietician

## 2014-09-06 ENCOUNTER — Encounter: Payer: Self-pay | Admitting: Dietician

## 2014-09-06 VITALS — Ht 71.0 in | Wt 214.0 lb

## 2014-09-06 DIAGNOSIS — Z6829 Body mass index (BMI) 29.0-29.9, adult: Secondary | ICD-10-CM | POA: Diagnosis not present

## 2014-09-06 DIAGNOSIS — Z713 Dietary counseling and surveillance: Secondary | ICD-10-CM | POA: Diagnosis not present

## 2014-09-06 DIAGNOSIS — E119 Type 2 diabetes mellitus without complications: Secondary | ICD-10-CM | POA: Insufficient documentation

## 2014-09-06 NOTE — Patient Instructions (Signed)
Great job on the changes that you have made! Aim for at least 30 minutes of exercise 6 days per week.   Consider adding pedometer app to phone.  Goal of 10,000 steps per day. Be mindful of portion size. Small portion protein with all meals and snacks. Aim for 3-4 carb choices per meal and 0-2 carb choices per snack if hungry. Take metformin when you have food in your stomach.

## 2014-09-06 NOTE — Progress Notes (Signed)
Diabetes Self-Management Education  Visit Type:  Initial   Appt. Start Time: 0945 Appt. End Time: 1100  09/06/2014  [name], identified by name and date of birth, is a 44 y.o. male with a new diagnosis of Diabetes: Type 2.  Other people present during visit:  Patient.    Patient lives alone he works for the [employer].  Girlfriend is supportive.  Patient states that he is very disciplined and has been making dietary changes.    ASSESSMENT  Height 5\' 11"  (1.803 m), weight 214 lb (97.07 kg). Body mass index is 29.86 kg/(m^2).  Initial Visit Information:  Are you currently following a meal plan?: No   Are you taking your medications as prescribed?: No (Is only taking Metformin once a day due to causing him to feel lightheaded.) Are you checking your feet?: No   How often do you need to have someone help you when you read instructions, pamphlets, or other written materials from your doctor or pharmacy?: 1 - Never What is the last grade level you completed in school?: 12th grade HS  Psychosocial:     Patient Belief/Attitude about Diabetes: Motivated to manage diabetes Self-care barriers: None Self-management support: Doctor's office, Friends Other persons present: Patient Patient Concerns: Nutrition/Meal planning, Healthy Lifestyle Special Needs: None Preferred Learning Style: No preference indicated Learning Readiness: Change in progress  Complications:   Last HgB A1C per patient/outside source: 12.1 mg/dL (08/14/14 new diagnosis of DM) How often do you check your blood sugar?: 1-2 times/day Fasting Blood glucose range (mg/dL): 70-129 Postprandial Blood glucose range (mg/dL): 70-129 Number of hypoglycemic episodes per month: 0 Number of hyperglycemic episodes per week: 0 Have you had a dilated eye exam in the past 12 months?: Yes Have you had a dental exam in the past 12 months?: No  Diet Intake: Does not eat pork Breakfast: cheerios (honey nut and plain) with almond  milk and berries and eggs with cheese (changed from chicken biscuit) Snack (morning): none Lunch: pizza, baked and fried chicken, mashed potato, coleslaw (out to eat) or salad with chicken, cheese and veges Snack (afternoon): none (was eating chips) Dinner: pizza, [place], chicken with starch and veges Snack (evening): sugar free ice cream, popcorn Beverage(s): flavored water, coke zero, sprite zero, water, almond milk (was drinking fruit punch and lemonade)  Exercise:  Exercise: Light (walking / raking leaves), Moderate (swimming / aerobic walking) Light Exercise amount of time (min / week):  (work related at [employer] center) Moderate Exercise amount of time (min / week): 15  Individualized Plan for Diabetes Self-Management Training:   Learning Objective:  Patient will have a greater understanding of diabetes self-management.  Patient education plan per assessed needs and concerns is to attend individual sessions for     Education Topics Reviewed with Patient Today:  Definition of diabetes, type 1 and 2, and the diagnosis of diabetes Role of diet in the treatment of diabetes and the relationship between the three main macronutrients and blood glucose level, Food label reading, portion sizes and measuring food., Carbohydrate counting, Reviewed blood glucose goals for pre and post meals and how to evaluate the patients' food intake on their blood glucose level., Meal timing in regards to the patients' current diabetes medication. Role of exercise on diabetes management, blood pressure control and cardiac health.   Daily foot exams, Yearly dilated eye exam, Identified appropriate SMBG and/or A1C goals.   Relationship between chronic complications and blood glucose control Role of stress on diabetes  Lifestyle issues that need to be addressed for better diabetes care  PATIENTS GOALS/Plan (Developed by the patient):  Nutrition: General guidelines for healthy choices and  portions discussed, Follow meal plan discussed Physical Activity: Exercise 5-7 days per week Medications: take my medication as prescribed Reducing Risk: do foot checks daily, examine blood glucose patterns  Plan:   Patient Instructions  Great job on the changes that you have made! Aim for at least 30 minutes of exercise 6 days per week.   Consider adding pedometer app to phone.  Goal of 10,000 steps per day. Be mindful of portion size. Small portion protein with all meals and snacks. Aim for 3-4 carb choices per meal and 0-2 carb choices per snack if hungry. Take metformin when you have food in your stomach.   Expected Outcomes:  Demonstrated interest in learning. Expect positive outcomes  Education material provided: Living Well with Diabetes, Food label handouts, A1C conversion sheet, Meal plan card, My Plate and Snack sheet  If problems or questions, patient to contact team via:  Phone and Email  Future DSME appointment: Other (comment), PRN (Patient would like to attend the Diabetes Classes)

## 2014-09-13 ENCOUNTER — Ambulatory Visit: Payer: Federal, State, Local not specified - PPO

## 2014-09-20 ENCOUNTER — Ambulatory Visit: Payer: Federal, State, Local not specified - PPO

## 2014-09-27 ENCOUNTER — Ambulatory Visit: Payer: Federal, State, Local not specified - PPO

## 2014-10-04 ENCOUNTER — Encounter: Payer: Federal, State, Local not specified - PPO | Attending: Podiatry

## 2014-10-04 VITALS — Ht 71.0 in | Wt 209.2 lb

## 2014-10-04 DIAGNOSIS — Z713 Dietary counseling and surveillance: Secondary | ICD-10-CM | POA: Insufficient documentation

## 2014-10-04 DIAGNOSIS — Z6829 Body mass index (BMI) 29.0-29.9, adult: Secondary | ICD-10-CM | POA: Diagnosis not present

## 2014-10-04 DIAGNOSIS — E119 Type 2 diabetes mellitus without complications: Secondary | ICD-10-CM | POA: Diagnosis not present

## 2014-10-04 NOTE — Progress Notes (Signed)

## 2014-10-11 DIAGNOSIS — E119 Type 2 diabetes mellitus without complications: Secondary | ICD-10-CM | POA: Diagnosis not present

## 2014-10-18 ENCOUNTER — Ambulatory Visit: Payer: Federal, State, Local not specified - PPO

## 2015-05-12 ENCOUNTER — Encounter (HOSPITAL_COMMUNITY): Payer: Self-pay | Admitting: *Deleted

## 2015-05-12 ENCOUNTER — Emergency Department (HOSPITAL_COMMUNITY): Payer: Federal, State, Local not specified - PPO

## 2015-05-12 ENCOUNTER — Emergency Department (HOSPITAL_COMMUNITY)
Admission: EM | Admit: 2015-05-12 | Discharge: 2015-05-12 | Disposition: A | Discharge status: Home / Self Care | Payer: Federal, State, Local not specified - PPO | Attending: Emergency Medicine | Admitting: Emergency Medicine

## 2015-05-12 DIAGNOSIS — R109 Unspecified abdominal pain: Secondary | ICD-10-CM | POA: Diagnosis present

## 2015-05-12 DIAGNOSIS — E119 Type 2 diabetes mellitus without complications: Secondary | ICD-10-CM | POA: Diagnosis not present

## 2015-05-12 DIAGNOSIS — Z79899 Other long term (current) drug therapy: Secondary | ICD-10-CM | POA: Insufficient documentation

## 2015-05-12 DIAGNOSIS — R10A0 Flank pain, unspecified side: Secondary | ICD-10-CM

## 2015-05-12 DIAGNOSIS — N201 Calculus of ureter: Secondary | ICD-10-CM

## 2015-05-12 LAB — URINALYSIS, ROUTINE W REFLEX MICROSCOPIC
Bilirubin Urine: NEGATIVE
Glucose, UA: NEGATIVE mg/dL
Ketones, ur: NEGATIVE mg/dL
Leukocytes, UA: NEGATIVE
Nitrite: NEGATIVE
Protein, ur: NEGATIVE mg/dL
Specific Gravity, Urine: 1.021 (ref 1.005–1.030)
pH: 6 (ref 5.0–8.0)

## 2015-05-12 LAB — URINE MICROSCOPIC-ADD ON: WBC, UA: NONE SEEN WBC/hpf (ref 0–5)

## 2015-05-12 MED ORDER — TAMSULOSIN HCL 0.4 MG PO CAPS: 0.4000 mg | ORAL_CAPSULE | Freq: Every day | ORAL | Status: DC

## 2015-05-12 MED ORDER — KETOROLAC TROMETHAMINE 30 MG/ML IJ SOLN
30.0000 mg | Freq: Once | INTRAMUSCULAR | Status: AC
  Administered 2015-05-12: 30 mg via INTRAVENOUS
  Filled 2015-05-12: qty 1

## 2015-05-12 MED ORDER — FENTANYL CITRATE (PF) 100 MCG/2ML IJ SOLN
100.0000 ug | Freq: Once | INTRAMUSCULAR | Status: AC
  Administered 2015-05-12: 100 ug via INTRAVENOUS
  Filled 2015-05-12: qty 2

## 2015-05-12 MED ORDER — OXYCODONE-ACETAMINOPHEN 5-325 MG PO TABS: 1.0000 | ORAL_TABLET | Freq: Four times a day (QID) | ORAL | Status: DC | PRN

## 2015-05-12 NOTE — ED Notes (Signed)
Pt states that he began have left mid abd pain approx 30 min ago; pt c/o sharp pain to the area; pt states that he has had kidney stones in the past and that this pain is similar to the prev episode

## 2015-05-12 NOTE — ED Notes (Signed)
Patient's brother-in-law is driving patient home.

## 2015-05-12 NOTE — ED Provider Notes (Signed)
CSN: [id number]     Arrival date & time 05/12/15  0536 History   First MD Initiated Contact with Patient 05/12/15 [phone]     Chief Complaint  Patient presents with  . Abdominal Pain     (Consider location/radiation/quality/duration/timing/severity/associated sxs/prior Treatment) HPI Patient presents to the emergency department with left flank pain that started 30 minutes prior to arrival.  Patient states was a sharp pain that was knifelike in quality.  The patient states it feels similar to previous episodes of kidney stone.  Patient states that this been several years since he had a stone.  Patient denies nausea, vomiting, weakness, dizziness, headache, blurred vision, back pain, dysuria, incontinence, bloody stool, hematemesis, fever or syncope.  The patient states that he did not take any medications prior to arrival Past Medical History  Diagnosis Date  . Nephrolithiasis   . Diabetes mellitus without complication ([place])    History reviewed. No pertinent past surgical history. No family history on file. Social History  Substance Use Topics  . Smoking status: Never Smoker   . Smokeless tobacco: None  . Alcohol Use: No    Review of Systems  All other systems negative except as documented in the HPI. All pertinent positives and negatives as reviewed in the HPI.=  Allergies  Review of patient's allergies indicates no known allergies.  Home Medications   Prior to Admission medications   Medication Sig Start Date End Date Taking? Authorizing Provider  metFORMIN (GLUCOPHAGE) 500 MG tablet Take 1 tablet (500 mg total) by mouth 2 (two) times daily with a meal. 08/16/14  Yes Jessica U Vann, DO  Pseudoephedrine-APAP-DM (DAYQUIL MULTI-SYMPTOM COLD/FLU PO) Take 30 mLs by mouth every 6 (six) hours.   Yes Historical Provider, MD  blood glucose meter kit and supplies KIT Dispense based on patient and insurance preference. Use up to four times daily as directed. (FOR ICD-9 250.00, 250.01).  08/16/14   Geradine Girt, DO  Insulin Detemir (LEVEMIR) 100 UNIT/ML Pen Inject 25 Units into the skin daily at 10 pm. Patient not taking: Reported on 05/12/2015 08/16/14   Geradine Girt, DO  oxyCODONE-acetaminophen (PERCOCET/ROXICET) 5-325 MG per tablet Take 1-2 tablets by mouth every 6 (six) hours as needed for pain. Patient not taking: Reported on 09/06/2014 02/04/12   Blanchie Dessert, MD  Tamsulosin HCl (FLOMAX) 0.4 MG CAPS Take 1 capsule (0.4 mg total) by mouth daily after supper. Patient not taking: Reported on 09/06/2014 02/04/12   Blanchie Dessert, MD   BP 135/91 mmHg  Pulse 80  Temp(Src) 99 F (37.2 C) (Oral)  Resp 18  SpO2 99% Physical Exam  Constitutional: He is oriented to person, place, and time. He appears well-developed and well-nourished. No distress.  HENT:  Head: Normocephalic and atraumatic.  Mouth/Throat: Oropharynx is clear and moist.  Eyes: Pupils are equal, round, and reactive to light.  Neck: Normal range of motion. Neck supple.  Cardiovascular: Normal rate, regular rhythm and normal heart sounds.  Exam reveals no gallop and no friction rub.   No murmur heard. Pulmonary/Chest: Effort normal and breath sounds normal. No respiratory distress. He has no wheezes.  Abdominal: Soft. Bowel sounds are normal. He exhibits no distension. There is no tenderness.  Neurological: He is alert and oriented to person, place, and time. He exhibits normal muscle tone. Coordination normal.  Skin: Skin is warm and dry. No rash noted. No erythema.  Psychiatric: He has a normal mood and affect. His behavior is normal.  Nursing note and vitals reviewed.  ED Course  Procedures (including critical care time) Labs Review Labs Reviewed  URINALYSIS, ROUTINE W REFLEX MICROSCOPIC (NOT AT [facility]) - Abnormal; Notable for the following:    APPearance CLOUDY (*)    Hgb urine dipstick LARGE (*)    All other components within normal limits  URINE MICROSCOPIC-ADD ON - Abnormal; Notable for the  following:    Squamous Epithelial / LPF 0-5 (*)    Bacteria, UA RARE (*)    All other components within normal limits    Imaging Review Ct Renal Stone Study  05/12/2015  CLINICAL DATA:  Left flank pain. No gross hematuria. History previous kidney stones. EXAM: CT ABDOMEN AND PELVIS WITHOUT CONTRAST TECHNIQUE: Multidetector CT imaging of the abdomen and pelvis was performed following the standard protocol without IV contrast. COMPARISON:  Previous exams including CT abdomen and pelvis dated 11/25/2010. FINDINGS: There is a 4 x 3 mm stone within the proximal left ureter causing mild left-sided hydronephrosis. Cyst within the upper pole of the right kidney is slightly enlarged, now measuring 2.7 cm greatest dimension. Liver, gallbladder, spleen, pancreas, and adrenal glands appear normal. Bowel is normal in caliber and configuration. No bowel wall thickening or evidence of bowel wall inflammation. Appendix is normal. Moderate amount of stool within the nondistended colon. No free fluid. The mildly prominent lymph nodes within the right lower quadrant are unchanged. No evidence of significant lymphadenopathy. No free intraperitoneal air. Abdominal aorta is normal in caliber. Lung bases are clear. No osseous abnormality. IMPRESSION: 1. 4 x 3 mm stone within the proximal left ureter causing mild left-sided hydronephrosis. 2. Right renal cyst. 3. Remainder of the abdomen and pelvis CT is unremarkable, as detailed above. Electronically Signed   By: Franki Cabot M.D.   On: 05/12/2015 07:33   I have personally reviewed and evaluated these images and lab results as part of my medical decision-making.  Patient is pain-free at this time, we will discharge him home with urology follow-up and pain control told to return here as needed.  Patient agrees the plan.  All questions were answered  Dalia Heading, PA-C 05/12/15 [place], MD 05/19/15 1152

## 2015-05-12 NOTE — Discharge Instructions (Signed)
Return here as needed.  Follow-up with the urologist provided. 

## 2015-05-12 NOTE — ED Notes (Signed)
Patient d/c'd self care.  F/U and medications discussed.  Patient verbalized understanding. 

## 2015-08-23 DIAGNOSIS — E785 Hyperlipidemia, unspecified: Secondary | ICD-10-CM | POA: Diagnosis not present

## 2015-08-23 DIAGNOSIS — I10 Essential (primary) hypertension: Secondary | ICD-10-CM | POA: Diagnosis not present

## 2015-08-23 DIAGNOSIS — E119 Type 2 diabetes mellitus without complications: Secondary | ICD-10-CM | POA: Diagnosis not present

## 2015-09-27 DIAGNOSIS — Z0001 Encounter for general adult medical examination with abnormal findings: Secondary | ICD-10-CM | POA: Diagnosis not present

## 2015-09-27 DIAGNOSIS — E109 Type 1 diabetes mellitus without complications: Secondary | ICD-10-CM | POA: Diagnosis not present

## 2015-09-27 DIAGNOSIS — Z125 Encounter for screening for malignant neoplasm of prostate: Secondary | ICD-10-CM | POA: Diagnosis not present

## 2015-09-27 DIAGNOSIS — I10 Essential (primary) hypertension: Secondary | ICD-10-CM | POA: Diagnosis not present

## 2015-10-04 DIAGNOSIS — Z0001 Encounter for general adult medical examination with abnormal findings: Secondary | ICD-10-CM | POA: Diagnosis not present

## 2015-10-04 DIAGNOSIS — I10 Essential (primary) hypertension: Secondary | ICD-10-CM | POA: Diagnosis not present

## 2015-10-04 DIAGNOSIS — E119 Type 2 diabetes mellitus without complications: Secondary | ICD-10-CM | POA: Diagnosis not present

## 2015-10-04 DIAGNOSIS — E785 Hyperlipidemia, unspecified: Secondary | ICD-10-CM | POA: Diagnosis not present

## 2016-04-01 DIAGNOSIS — E119 Type 2 diabetes mellitus without complications: Secondary | ICD-10-CM | POA: Diagnosis not present

## 2016-04-01 DIAGNOSIS — I10 Essential (primary) hypertension: Secondary | ICD-10-CM | POA: Diagnosis not present

## 2016-04-08 DIAGNOSIS — E785 Hyperlipidemia, unspecified: Secondary | ICD-10-CM | POA: Diagnosis not present

## 2016-04-08 DIAGNOSIS — R946 Abnormal results of thyroid function studies: Secondary | ICD-10-CM | POA: Diagnosis not present

## 2016-04-08 DIAGNOSIS — I10 Essential (primary) hypertension: Secondary | ICD-10-CM | POA: Diagnosis not present

## 2016-10-01 DIAGNOSIS — Z0000 Encounter for general adult medical examination without abnormal findings: Secondary | ICD-10-CM | POA: Diagnosis not present

## 2016-10-01 DIAGNOSIS — Z13220 Encounter for screening for lipoid disorders: Secondary | ICD-10-CM | POA: Diagnosis not present

## 2016-10-01 DIAGNOSIS — Z1321 Encounter for screening for nutritional disorder: Secondary | ICD-10-CM | POA: Diagnosis not present

## 2016-10-15 DIAGNOSIS — Z0001 Encounter for general adult medical examination with abnormal findings: Secondary | ICD-10-CM | POA: Diagnosis not present

## 2019-01-09 ENCOUNTER — Encounter (HOSPITAL_COMMUNITY): Payer: Self-pay | Admitting: Emergency Medicine

## 2019-01-09 ENCOUNTER — Emergency Department (HOSPITAL_COMMUNITY)
Admission: EM | Admit: 2019-01-09 | Discharge: 2019-01-09 | Disposition: A | Discharge status: Home / Self Care | Payer: Federal, State, Local not specified - PPO | Attending: Emergency Medicine | Admitting: Emergency Medicine

## 2019-01-09 ENCOUNTER — Other Ambulatory Visit: Payer: Self-pay

## 2019-01-09 DIAGNOSIS — I129 Hypertensive chronic kidney disease with stage 1 through stage 4 chronic kidney disease, or unspecified chronic kidney disease: Secondary | ICD-10-CM | POA: Insufficient documentation

## 2019-01-09 DIAGNOSIS — N289 Disorder of kidney and ureter, unspecified: Secondary | ICD-10-CM | POA: Diagnosis not present

## 2019-01-09 DIAGNOSIS — R739 Hyperglycemia, unspecified: Secondary | ICD-10-CM

## 2019-01-09 DIAGNOSIS — E1165 Type 2 diabetes mellitus with hyperglycemia: Secondary | ICD-10-CM | POA: Insufficient documentation

## 2019-01-09 DIAGNOSIS — Z9114 Patient's other noncompliance with medication regimen: Secondary | ICD-10-CM | POA: Diagnosis not present

## 2019-01-09 DIAGNOSIS — Z79899 Other long term (current) drug therapy: Secondary | ICD-10-CM | POA: Insufficient documentation

## 2019-01-09 DIAGNOSIS — R531 Weakness: Secondary | ICD-10-CM | POA: Diagnosis not present

## 2019-01-09 DIAGNOSIS — E860 Dehydration: Secondary | ICD-10-CM | POA: Insufficient documentation

## 2019-01-09 DIAGNOSIS — N182 Chronic kidney disease, stage 2 (mild): Secondary | ICD-10-CM | POA: Insufficient documentation

## 2019-01-09 LAB — CBC WITH DIFFERENTIAL/PLATELET
Abs Immature Granulocytes: 0 10*3/uL (ref 0.00–0.07)
Basophils Absolute: 0 10*3/uL (ref 0.0–0.1)
Basophils Relative: 1 %
Eosinophils Absolute: 0 10*3/uL (ref 0.0–0.5)
Eosinophils Relative: 0 %
HCT: 49.2 % (ref 39.0–52.0)
Hemoglobin: 16 g/dL (ref 13.0–17.0)
Immature Granulocytes: 0 %
Lymphocytes Relative: 29 %
Lymphs Abs: 1.6 10*3/uL (ref 0.7–4.0)
MCH: 26.2 pg (ref 26.0–34.0)
MCHC: 32.5 g/dL (ref 30.0–36.0)
MCV: 80.7 fL (ref 80.0–100.0)
Monocytes Absolute: 0.3 10*3/uL (ref 0.1–1.0)
Monocytes Relative: 6 %
Neutro Abs: 3.4 10*3/uL (ref 1.7–7.7)
Neutrophils Relative %: 64 %
Platelets: 194 10*3/uL (ref 150–400)
RBC: 6.1 MIL/uL — ABNORMAL HIGH (ref 4.22–5.81)
RDW: 13.7 % (ref 11.5–15.5)
WBC: 5.3 10*3/uL (ref 4.0–10.5)
nRBC: 0 % (ref 0.0–0.2)

## 2019-01-09 LAB — COMPREHENSIVE METABOLIC PANEL
ALT: 38 U/L (ref 0–44)
AST: 19 U/L (ref 15–41)
Albumin: 4.3 g/dL (ref 3.5–5.0)
Alkaline Phosphatase: 81 U/L (ref 38–126)
Anion gap: 17 — ABNORMAL HIGH (ref 5–15)
BUN: 32 mg/dL — ABNORMAL HIGH (ref 6–20)
CO2: 21 mmol/L — ABNORMAL LOW (ref 22–32)
Calcium: 9.3 mg/dL (ref 8.9–10.3)
Chloride: 101 mmol/L (ref 98–111)
Creatinine, Ser: 1.51 mg/dL — ABNORMAL HIGH (ref 0.61–1.24)
GFR calc Af Amer: >60 mL/min (ref >60)
GFR calc non Af Amer: 55 mL/min — ABNORMAL LOW (ref >60)
Glucose, Bld: 404 mg/dL — ABNORMAL HIGH (ref 70–99)
Potassium: 4.2 mmol/L (ref 3.5–5.1)
Sodium: 139 mmol/L (ref 135–145)
Total Bilirubin: 0.9 mg/dL (ref 0.3–1.2)
Total Protein: 8.6 g/dL — ABNORMAL HIGH (ref 6.5–8.1)

## 2019-01-09 LAB — URINALYSIS, ROUTINE W REFLEX MICROSCOPIC
Bacteria, UA: NONE SEEN
Bilirubin Urine: NEGATIVE
Glucose, UA: >=500 mg/dL — AB
Hgb urine dipstick: NEGATIVE
Ketones, ur: 20 mg/dL — AB
Leukocytes,Ua: NEGATIVE
Nitrite: NEGATIVE
Protein, ur: NEGATIVE mg/dL
Specific Gravity, Urine: 1.038 — ABNORMAL HIGH (ref 1.005–1.030)
pH: 5 (ref 5.0–8.0)

## 2019-01-09 LAB — BLOOD GAS, VENOUS
Acid-base deficit: 0.9 mmol/L (ref 0.0–2.0)
Bicarbonate: 23.4 mmol/L (ref 20.0–28.0)
FIO2: 21
O2 Saturation: 86.6 %
Patient temperature: 98.2
pCO2, Ven: 39.7 mmHg — ABNORMAL LOW (ref 44.0–60.0)
pH, Ven: 7.388 (ref 7.250–7.430)
pO2, Ven: 52.5 mmHg — ABNORMAL HIGH (ref 32.0–45.0)

## 2019-01-09 LAB — CBG MONITORING, ED: Glucose-Capillary: 393 mg/dL — ABNORMAL HIGH (ref 70–99)

## 2019-01-09 MED ORDER — SODIUM CHLORIDE 0.9 % IV BOLUS
1000.0000 mL | Freq: Once | INTRAVENOUS | Status: AC
  Administered 2019-01-09: 21:00:00 1000 mL via INTRAVENOUS

## 2019-01-09 MED ORDER — METFORMIN HCL 500 MG PO TABS: 500.0000 mg | ORAL_TABLET | Freq: Two times a day (BID) | ORAL | 1 refills | Status: DC

## 2019-01-09 MED ORDER — METFORMIN HCL 500 MG PO TABS
500.0000 mg | ORAL_TABLET | Freq: Once | ORAL | Status: AC
  Administered 2019-01-09: 22:00:00 500 mg via ORAL
  Filled 2019-01-09: qty 1

## 2019-01-09 NOTE — ED Notes (Signed)
Pt given [place] no bread, and diet [place] mixed with water. Attempted to educate pt on diet and exercise and medications.

## 2019-01-09 NOTE — Discharge Instructions (Addendum)
Your blood sugar is elevated indicating that you need to start taking medications for diabetes again.  At this time we are recommending restarting the metformin.  It is important to watch your carbohydrate intake, and we will have a diabetes coordinator call you to help you get started with that.  Also you should follow-up with your primary care doctor or the doctor of your choice for further care and treatment.  Your doctor can work to help you with a referral to a dietitian to improve your care of diabetes.  Your kidney function is showing some signs of dehydration.  It is important to try to drink 2 L of water each day, in addition to your usual fluid intake.  Since diabetes itself can cause kidney problems you should have your doctor follow-up on this when you see him.  Return here, if needed, for problems.

## 2019-01-09 NOTE — ED Triage Notes (Signed)
Pt reports being diabetic and has not taken Metformin in the last 6 months.

## 2019-01-09 NOTE — ED Triage Notes (Signed)
Pt reports increasing weakness over the last 3 days and reported dry mouth and feeling dehydrated.

## 2019-01-09 NOTE — ED Provider Notes (Signed)
[facility] DEPT Provider Note   CSN: [id number] Arrival date & time: 01/09/19  1857     History   Chief Complaint Chief Complaint  Patient presents with  . Weakness    HPI Mark Fry is a 48 y.o. male.     HPI   Who presents for evaluation of sensation of dry mouth, and generalized weakness.  Symptoms onset several days ago and worsening.  He stopped taking metformin about 6 months ago because he did not like how it made him feel and look.  He denies fever, chills, nausea, vomiting, focal weakness or paresthesia.  He was unable to work today because of the discomfort.  No other recent illnesses.  There are no other known modifying factors.  Past Medical History:  Diagnosis Date  . Diabetes mellitus without complication ([place])   . Nephrolithiasis     Patient Active Problem List   Diagnosis Date Noted  . Hyperglycemia 08/14/2014  . Hypertension 08/14/2014  . Polycythemia 08/14/2014  . CKD (chronic kidney disease), stage II 08/14/2014  . Diabetes mellitus, new onset ([place]) 08/14/2014    No past surgical history on file.      Home Medications    Prior to Admission medications   Medication Sig Start Date End Date Taking? Authorizing Provider  Multiple Vitamin (MULTIVITAMIN) tablet Take 1 tablet by mouth daily.   Yes [provider]  naproxen sodium (ALEVE) 220 MG tablet Take 220 mg by mouth 2 (two) times daily as needed (pain).   Yes [provider]  Pseudoephedrine-APAP-DM (DAYQUIL MULTI-SYMPTOM COLD/FLU PO) Take 30 mLs by mouth every 6 (six) hours as needed (cough, headache, congestion).    Yes [provider]  blood glucose meter kit and supplies KIT Dispense based on patient and insurance preference. Use up to four times daily as directed. (FOR ICD-9 250.00, 250.01). 08/16/14   Geradine Girt, DO  Insulin Detemir (LEVEMIR) 100 UNIT/ML Pen Inject 25 Units into the skin daily at 10 pm. Patient not taking:  Reported on 05/12/2015 08/16/14   Geradine Girt, DO  metFORMIN (GLUCOPHAGE) 500 MG tablet Take 1 tablet (500 mg total) by mouth 2 (two) times daily with a meal. 01/09/19   Daleen Bo, MD  oxyCODONE-acetaminophen (PERCOCET/ROXICET) 5-325 MG tablet Take 1 tablet by mouth every 6 (six) hours as needed for severe pain. Patient not taking: Reported on 01/09/2019 05/12/15   Dalia Heading, PA-C  tamsulosin (FLOMAX) 0.4 MG CAPS capsule Take 1 capsule (0.4 mg total) by mouth daily. Patient not taking: Reported on 01/09/2019 05/12/15   Dalia Heading, PA-C    Family History No family history on file.  Social History Social History   Tobacco Use  . Smoking status: Never Smoker  . Smokeless tobacco: Never Used  Substance Use Topics  . Alcohol use: No  . Drug use: No     Allergies   Patient has no known allergies.   Review of Systems Review of Systems  All other systems reviewed and are negative.    Physical Exam Updated Vital Signs BP (!) 150/99 (BP Location: Right Arm)   Pulse (!) 101   Temp 98.3 F (36.8 C) (Oral)   Resp 18   Ht 6' (1.829 m)   Wt 95.3 kg   SpO2 100%   BMI 28.48 kg/m   Physical Exam Vitals signs and nursing note reviewed.  Constitutional:      General: He is not in acute distress.    Appearance: He is  well-developed. He is not ill-appearing, toxic-appearing or diaphoretic.  HENT:     Head: Normocephalic and atraumatic.     Right Ear: External ear normal.     Left Ear: External ear normal.  Eyes:     Conjunctiva/sclera: Conjunctivae normal.     Pupils: Pupils are equal, round, and reactive to light.  Neck:     Musculoskeletal: Normal range of motion and neck supple.     Trachea: Phonation normal.  Cardiovascular:     Rate and Rhythm: Normal rate.  Pulmonary:     Effort: Pulmonary effort is normal.  Musculoskeletal: Normal range of motion.  Skin:    General: Skin is warm and dry.  Neurological:     Mental Status: He is alert and  oriented to person, place, and time.     Cranial Nerves: No cranial nerve deficit.     Sensory: No sensory deficit.     Motor: No abnormal muscle tone.     Coordination: Coordination normal.  Psychiatric:        Mood and Affect: Mood normal.        Behavior: Behavior normal.        Thought Content: Thought content normal.        Judgment: Judgment normal.      ED Treatments / Results  Labs (all labs ordered are listed, but only abnormal results are displayed) Labs Reviewed  BLOOD GAS, VENOUS - Abnormal; Notable for the following components:      Result Value   pCO2, Ven 39.7 (*)    pO2, Ven 52.5 (*)    All other components within normal limits  COMPREHENSIVE METABOLIC PANEL - Abnormal; Notable for the following components:   CO2 21 (*)    Glucose, Bld 404 (*)    BUN 32 (*)    Creatinine, Ser 1.51 (*)    Total Protein 8.6 (*)    GFR calc non Af Amer 55 (*)    Anion gap 17 (*)    All other components within normal limits  CBC WITH DIFFERENTIAL/PLATELET - Abnormal; Notable for the following components:   RBC 6.10 (*)    All other components within normal limits  URINALYSIS, ROUTINE W REFLEX MICROSCOPIC - Abnormal; Notable for the following components:   Color, Urine STRAW (*)    Specific Gravity, Urine 1.038 (*)    Glucose, UA >=500 (*)    Ketones, ur 20 (*)    All other components within normal limits  CBG MONITORING, ED - Abnormal; Notable for the following components:   Glucose-Capillary 393 (*)    All other components within normal limits    EKG None  Radiology No results found.  Procedures Procedures (including critical care time)  Medications Ordered in ED Medications  sodium chloride 0.9 % bolus 1,000 mL (0 mLs Intravenous Stopped 01/09/19 2211)  metFORMIN (GLUCOPHAGE) tablet 500 mg (500 mg Oral Given 01/09/19 2220)     Initial Impression / Assessment and Plan / ED Course  I have reviewed the triage vital signs and the nursing notes.  Pertinent labs  & imaging results that were available during my care of the patient were reviewed by me and considered in my medical decision making (see chart for details).  Clinical Course as of Jan 08 2246  Sat Jan 09, 2019  2158 Normal except PCO2 low and PO2 low  Blood gas, venous(!) [EW]  2159 Normal  CBC with Differential(!) [EW]  2159 Elevated  POC CBG, ED(!) [EW]  2159  Normal except CO2 low, glucose high, BUN high, creatinine high, total protein high  Comprehensive metabolic panel(!) [EW]    Clinical Course User Index [EW] Daleen Bo, MD        Patient Vitals for the past 24 hrs:  BP Temp Temp src Pulse Resp SpO2 Height Weight  01/09/19 2222 (!) 150/99 98.3 F (36.8 C) Oral (!) 101 18 100 % - -  01/09/19 1918 (!) 152/104 98.2 F (36.8 C) Oral (!) 108 18 100 % 6' (1.829 m) 95.3 kg  01/09/19 1917 - - - - - - 6' (1.829 m) 95.3 kg  01/09/19 1916 (!) 152/104 98.2 F (36.8 C) Oral (!) 109 14 99 % - -    9:58 PM Reevaluation with update and discussion. After initial assessment and treatment, an updated evaluation reveals no change in clinical status, findings discussed with the patient all questions answered. Daleen Bo   Medical Decision Making: Hyperglycemia, secondary to medication noncompliance.  Mild renal insufficiency secondary to dehydration, likely due to chronic noncompliance.  Treated ED with IV fluids.  No metabolic instability, suspected infection or indicator for impending vascular collapse.  CRITICAL CARE-no Performed by: Daleen Bo  Nursing Notes Reviewed/ Care Coordinated Applicable Imaging Reviewed Interpretation of Laboratory Data incorporated into ED treatment  The patient appears reasonably screened and/or stabilized for discharge and I doubt any other medical condition or other [facility] requiring further screening, evaluation, or treatment in the ED at this time prior to discharge.  Plan: Home Medications-restart usual medications; Home Treatments-standard low  carbohydrate diet; return here if the recommended treatment, does not improve the symptoms; Recommended follow up-PCP checkup 1 week and as needed    Final Clinical Impressions(s) / ED Diagnoses   Final diagnoses:  Hyperglycemia  Dehydration  Renal insufficiency    ED Discharge Orders         Ordered    metFORMIN (GLUCOPHAGE) 500 MG tablet  2 times daily with meals     01/09/19 2243           Daleen Bo, MD 01/09/19 2247

## 2019-03-17 ENCOUNTER — Encounter (HOSPITAL_COMMUNITY): Payer: Self-pay | Admitting: *Deleted

## 2019-03-17 ENCOUNTER — Emergency Department (HOSPITAL_COMMUNITY)
Admission: EM | Admit: 2019-03-17 | Discharge: 2019-03-17 | Disposition: A | Discharge status: Home / Self Care | Payer: Federal, State, Local not specified - PPO | Attending: Emergency Medicine | Admitting: Emergency Medicine

## 2019-03-17 ENCOUNTER — Emergency Department (HOSPITAL_COMMUNITY): Payer: Federal, State, Local not specified - PPO

## 2019-03-17 ENCOUNTER — Other Ambulatory Visit: Payer: Self-pay

## 2019-03-17 DIAGNOSIS — E1110 Type 2 diabetes mellitus with ketoacidosis without coma: Secondary | ICD-10-CM | POA: Diagnosis not present

## 2019-03-17 DIAGNOSIS — R739 Hyperglycemia, unspecified: Secondary | ICD-10-CM

## 2019-03-17 DIAGNOSIS — Z79899 Other long term (current) drug therapy: Secondary | ICD-10-CM | POA: Diagnosis not present

## 2019-03-17 DIAGNOSIS — N182 Chronic kidney disease, stage 2 (mild): Secondary | ICD-10-CM | POA: Diagnosis not present

## 2019-03-17 DIAGNOSIS — I129 Hypertensive chronic kidney disease with stage 1 through stage 4 chronic kidney disease, or unspecified chronic kidney disease: Secondary | ICD-10-CM | POA: Diagnosis not present

## 2019-03-17 DIAGNOSIS — E1165 Type 2 diabetes mellitus with hyperglycemia: Secondary | ICD-10-CM | POA: Insufficient documentation

## 2019-03-17 DIAGNOSIS — E1010 Type 1 diabetes mellitus with ketoacidosis without coma: Secondary | ICD-10-CM | POA: Diagnosis not present

## 2019-03-17 DIAGNOSIS — Z794 Long term (current) use of insulin: Secondary | ICD-10-CM | POA: Diagnosis not present

## 2019-03-17 DIAGNOSIS — E1122 Type 2 diabetes mellitus with diabetic chronic kidney disease: Secondary | ICD-10-CM | POA: Insufficient documentation

## 2019-03-17 LAB — CBC WITH DIFFERENTIAL/PLATELET
Abs Immature Granulocytes: 0.01 10*3/uL (ref 0.00–0.07)
Basophils Absolute: 0 10*3/uL (ref 0.0–0.1)
Basophils Relative: 1 %
Eosinophils Absolute: 0 10*3/uL (ref 0.0–0.5)
Eosinophils Relative: 1 %
HCT: 47.9 % (ref 39.0–52.0)
Hemoglobin: 15.6 g/dL (ref 13.0–17.0)
Immature Granulocytes: 0 %
Lymphocytes Relative: 26 %
Lymphs Abs: 1.1 10*3/uL (ref 0.7–4.0)
MCH: 26.5 pg (ref 26.0–34.0)
MCHC: 32.6 g/dL (ref 30.0–36.0)
MCV: 81.3 fL (ref 80.0–100.0)
Monocytes Absolute: 0.2 10*3/uL (ref 0.1–1.0)
Monocytes Relative: 5 %
Neutro Abs: 2.9 10*3/uL (ref 1.7–7.7)
Neutrophils Relative %: 67 %
Platelets: 214 10*3/uL (ref 150–400)
RBC: 5.89 MIL/uL — ABNORMAL HIGH (ref 4.22–5.81)
RDW: 13.8 % (ref 11.5–15.5)
WBC: 4.3 10*3/uL (ref 4.0–10.5)
nRBC: 0 % (ref 0.0–0.2)

## 2019-03-17 LAB — BASIC METABOLIC PANEL
Anion gap: 19 — ABNORMAL HIGH (ref 5–15)
Anion gap: 14 (ref 5–15)
BUN: 26 mg/dL — ABNORMAL HIGH (ref 6–20)
BUN: 21 mg/dL — ABNORMAL HIGH (ref 6–20)
CO2: 16 mmol/L — ABNORMAL LOW (ref 22–32)
CO2: 18 mmol/L — ABNORMAL LOW (ref 22–32)
Calcium: 9.4 mg/dL (ref 8.9–10.3)
Calcium: 8.5 mg/dL — ABNORMAL LOW (ref 8.9–10.3)
Chloride: 94 mmol/L — ABNORMAL LOW (ref 98–111)
Chloride: 104 mmol/L (ref 98–111)
Creatinine, Ser: 1.29 mg/dL — ABNORMAL HIGH (ref 0.61–1.24)
Creatinine, Ser: 1.11 mg/dL (ref 0.61–1.24)
GFR calc Af Amer: >60 mL/min (ref >60)
GFR calc Af Amer: >60 mL/min (ref >60)
GFR calc non Af Amer: >60 mL/min (ref >60)
GFR calc non Af Amer: >60 mL/min (ref >60)
Glucose, Bld: 508 mg/dL (ref 70–99)
Glucose, Bld: 284 mg/dL — ABNORMAL HIGH (ref 70–99)
Potassium: 4.8 mmol/L (ref 3.5–5.1)
Potassium: 3.9 mmol/L (ref 3.5–5.1)
Sodium: 129 mmol/L — ABNORMAL LOW (ref 135–145)
Sodium: 136 mmol/L (ref 135–145)

## 2019-03-17 LAB — URINALYSIS, ROUTINE W REFLEX MICROSCOPIC
Bacteria, UA: NONE SEEN
Bilirubin Urine: NEGATIVE
Glucose, UA: >=500 mg/dL — AB
Hgb urine dipstick: NEGATIVE
Ketones, ur: 80 mg/dL — AB
Leukocytes,Ua: NEGATIVE
Nitrite: NEGATIVE
Protein, ur: NEGATIVE mg/dL
Specific Gravity, Urine: 1.031 — ABNORMAL HIGH (ref 1.005–1.030)
pH: 5 (ref 5.0–8.0)

## 2019-03-17 LAB — CBG MONITORING, ED
Glucose-Capillary: 514 mg/dL (ref 70–99)
Glucose-Capillary: 388 mg/dL — ABNORMAL HIGH (ref 70–99)

## 2019-03-17 LAB — BLOOD GAS, VENOUS
Acid-base deficit: 6.4 mmol/L — ABNORMAL HIGH (ref 0.0–2.0)
Bicarbonate: 18.9 mmol/L — ABNORMAL LOW (ref 20.0–28.0)
O2 Saturation: 75 %
Patient temperature: 98.6
pCO2, Ven: 38.6 mmHg — ABNORMAL LOW (ref 44.0–60.0)
pH, Ven: 7.311 (ref 7.250–7.430)
pO2, Ven: 42.8 mmHg (ref 32.0–45.0)

## 2019-03-17 MED ORDER — INSULIN ASPART 100 UNIT/ML ~~LOC~~ SOLN
10.0000 [IU] | Freq: Once | SUBCUTANEOUS | Status: AC
  Administered 2019-03-17: 10 [IU] via INTRAVENOUS
  Filled 2019-03-17: qty 0.1

## 2019-03-17 MED ORDER — INSULIN REGULAR(HUMAN) IN NACL 100-0.9 UT/100ML-% IV SOLN: INTRAVENOUS | Status: DC

## 2019-03-17 MED ORDER — SODIUM CHLORIDE 0.9 % IV BOLUS
2000.0000 mL | Freq: Once | INTRAVENOUS | Status: AC
  Administered 2019-03-17: 2000 mL via INTRAVENOUS

## 2019-03-17 MED ORDER — POTASSIUM CHLORIDE 10 MEQ/100ML IV SOLN
10.0000 meq | INTRAVENOUS | Status: AC
  Administered 2019-03-17: 10 meq via INTRAVENOUS
  Filled 2019-03-17: qty 100

## 2019-03-17 NOTE — ED Provider Notes (Signed)
[facility] DEPT Provider Note   CSN: [id number] Arrival date & time: 03/17/19  1259     History   Chief Complaint Chief Complaint  Patient presents with   Hyperglycemia    HPI Mark Fry is a 48 y.o. male presenting to emergency department with hyperglycemia.  Patient reports that his blood sugars have been running high for the past few days, noting ranges in the 300s and 400s.  He states he is been urinating a lot the past few days.  He feels thirsty all the time.  He denies any fevers, chills, cough, congestion, dysuria, abdominal pain, diarrhea, nausea or vomiting.  He states he has been eating normally at home.  He reports that he is supposed to be taking Metformin twice a day, and has not missed any doses.  He denies taking any insulin.  He says he was formerly on Levemir but taken off of that a few months ago, because his diabetes was under control.  Patient was most recently seen in the ER on August 29 for similar symptoms.     HPI  Past Medical History:  Diagnosis Date   Diabetes mellitus without complication ([place])    Nephrolithiasis     Patient Active Problem List   Diagnosis Date Noted   Hyperglycemia 08/14/2014   Hypertension 08/14/2014   Polycythemia 08/14/2014   CKD (chronic kidney disease), stage II 08/14/2014   Diabetes mellitus, new onset ([place]) 08/14/2014    History reviewed. No pertinent surgical history.      Home Medications    Prior to Admission medications   Medication Sig Start Date End Date Taking? Authorizing Provider  blood glucose meter kit and supplies KIT Dispense based on patient and insurance preference. Use up to four times daily as directed. (FOR ICD-9 250.00, 250.01). 08/16/14  Yes Vann, Jessica U, DO  metFORMIN (GLUCOPHAGE) 500 MG tablet Take 1 tablet (500 mg total) by mouth 2 (two) times daily with a meal. 01/09/19  Yes Daleen Bo, MD  Multiple Vitamin (MULTIVITAMIN) tablet Take 1 tablet by  mouth daily.   Yes [provider]  naproxen sodium (ALEVE) 220 MG tablet Take 220 mg by mouth 2 (two) times daily as needed (pain).   Yes [provider]  Insulin Detemir (LEVEMIR) 100 UNIT/ML Pen Inject 25 Units into the skin daily at 10 pm. Patient not taking: Reported on 05/12/2015 08/16/14   Geradine Girt, DO  oxyCODONE-acetaminophen (PERCOCET/ROXICET) 5-325 MG tablet Take 1 tablet by mouth every 6 (six) hours as needed for severe pain. Patient not taking: Reported on 01/09/2019 05/12/15   Dalia Heading, PA-C  tamsulosin (FLOMAX) 0.4 MG CAPS capsule Take 1 capsule (0.4 mg total) by mouth daily. Patient not taking: Reported on 01/09/2019 05/12/15   Dalia Heading, PA-C    Family History No family history on file.  Social History Social History   Tobacco Use   Smoking status: Never Smoker   Smokeless tobacco: Never Used  Substance Use Topics   Alcohol use: No   Drug use: No     Allergies   Patient has no known allergies.   Review of Systems Review of Systems  Constitutional: Negative for chills and fever.  Eyes: Negative for photophobia and visual disturbance.  Respiratory: Negative for cough and shortness of breath.   Cardiovascular: Negative for chest pain and palpitations.  Gastrointestinal: Negative for abdominal pain, diarrhea, nausea and vomiting.  Genitourinary: Positive for frequency. Negative for difficulty urinating, dysuria and hematuria.  Musculoskeletal:  Negative for arthralgias and back pain.  Skin: Negative for pallor and rash.  Neurological: Negative for syncope and light-headedness.  All other systems reviewed and are negative.    Physical Exam Updated Vital Signs BP (!) 136/97    Pulse 90    Temp 98.3 F (36.8 C) (Oral)    Resp 16    Ht 6' (1.829 m)    SpO2 99%    BMI 28.48 kg/m   Physical Exam Vitals signs and nursing note reviewed.  Constitutional:      Appearance: He is well-developed.  HENT:     Head:  Normocephalic and atraumatic.     Comments: Tachy mucous membranes Eyes:     Conjunctiva/sclera: Conjunctivae normal.  Neck:     Musculoskeletal: Neck supple.  Cardiovascular:     Rate and Rhythm: Regular rhythm. Tachycardia present.     Heart sounds: No murmur.     Comments: HR 101 Pulmonary:     Effort: Pulmonary effort is normal. No respiratory distress.     Breath sounds: Normal breath sounds.  Abdominal:     General: There is no distension.     Palpations: Abdomen is soft.     Tenderness: There is no abdominal tenderness.  Skin:    General: Skin is warm and dry.  Neurological:     Mental Status: He is alert.      ED Treatments / Results  Labs (all labs ordered are listed, but only abnormal results are displayed) Labs Reviewed  BASIC METABOLIC PANEL - Abnormal; Notable for the following components:      Result Value   Sodium 129 (*)    Chloride 94 (*)    CO2 16 (*)    Glucose, Bld 508 (*)    BUN 26 (*)    Creatinine, Ser 1.29 (*)    Anion gap 19 (*)    All other components within normal limits  URINALYSIS, ROUTINE W REFLEX MICROSCOPIC - Abnormal; Notable for the following components:   Color, Urine STRAW (*)    Specific Gravity, Urine 1.031 (*)    Glucose, UA >=500 (*)    Ketones, ur 80 (*)    All other components within normal limits  CBC WITH DIFFERENTIAL/PLATELET - Abnormal; Notable for the following components:   RBC 5.89 (*)    All other components within normal limits  CBG MONITORING, ED - Abnormal; Notable for the following components:   Glucose-Capillary 514 (*)    All other components within normal limits  BLOOD GAS, VENOUS  BASIC METABOLIC PANEL  CBG MONITORING, ED    EKG None  Radiology Dg Chest Portable 1 View  Result Date: 03/17/2019 CLINICAL DATA:  DKA EXAM: PORTABLE CHEST 1 VIEW COMPARISON:  08/14/2014 FINDINGS: The heart size and mediastinal contours are within normal limits. Both lungs are clear. The visualized skeletal structures are  unremarkable. IMPRESSION: No active disease. Electronically Signed   By: Donavan Foil M.D.   On: 03/17/2019 15:23    Procedures Procedures (including critical care time)  Medications Ordered in ED Medications  insulin regular, human (MYXREDLIN) 100 units/ 100 mL infusion (has no administration in time range)  potassium chloride 10 mEq in 100 mL IVPB (has no administration in time range)  sodium chloride 0.9 % bolus 2,000 mL (2,000 mLs Intravenous New Bag/Given 03/17/19 1425)     Initial Impression / Assessment and Plan / ED Course  I have reviewed the triage vital signs and the nursing notes.  Pertinent labs & imaging  results that were available during my care of the patient were reviewed by me and considered in my medical decision making (see chart for details).  48 year old male presented emergency department with symptomatic hypoglycemia.  He states has been compliant with his Metformin.  Does appear that he is a poor diet.  No reported infectious symptoms.  We will check a UA and chest x-ray.  Also check his electrolytes for an anion gap.  We will give him IV fluids, and after ascertaining his potassium level, will also consider insulin.  Clinical Course as of Mar 17 1551  Wed Mar 17, 2019  1549 K 4.8, Glucose 508, Cr 1.29, Anion gap 19.  Plan to give 2 L fluid bolus.  Will start insulin glucose stabilizer, with 1K rider.  Recheck a BMP after the fluid bolus and insulin bolus.  The patient's labs and gap are improving, believe it is reasonable to discharge him and have him follow-up with his primary care doctor.   [MT]  1552 Pt signed out to evening provider   [MT]    Clinical Course User Index [MT] Magaret Justo, Carola Rhine, MD     Final Clinical Impressions(s) / ED Diagnoses   Final diagnoses:  Hyperglycemia    ED Discharge Orders    None       Langston Masker Carola Rhine, MD 03/17/19 [phone]

## 2019-03-17 NOTE — ED Triage Notes (Signed)
Pt states his blood sugar has been reading high over the last two days.  Pt CBG is 514 in triage.  Pt only reports feeling drained and having dry mouth. Pt a/o x 4 and ambulatory.

## 2019-03-17 NOTE — ED Notes (Signed)
CRITICAL VALUE STICKER  CRITICAL VALUE: blood sugar of 508  RECEIVER (on-site recipient of call): Merle Tai, RN  DATE & TIME NOTIFIED: 03/17/2019 @ 14:59  MESSENGER (representative from lab): Kathleen, Lab Tech  MD NOTIFIED: Dr.Trifan  TIME OF NOTIFICATION: 15:01  RESPONSE: Dr. Trifan acknowledged critical value.  

## 2019-03-17 NOTE — Discharge Instructions (Addendum)
It is very important you call your primary care doctor's office tomorrow.  You should be seen again or talk to your doctor within the next few days.  Your blood sugars are very high in the ER today.  You may need to be started back on insulin again.  In the meantime, it is extremely important you continue taking your Metformin as prescribed.  You should avoid sugary drinks and high carbohydrate foods.  For the next several days, you should make an effort to drink a lot of regular water.  Your body is likely dehydrated and needs a large amount of water.

## 2019-03-22 ENCOUNTER — Other Ambulatory Visit: Payer: Self-pay

## 2019-03-23 ENCOUNTER — Encounter: Payer: Federal, State, Local not specified - PPO | Admitting: Family Medicine

## 2019-03-24 NOTE — Progress Notes (Signed)
This encounter was created in error - please disregard.

## 2019-03-30 ENCOUNTER — Other Ambulatory Visit: Payer: Self-pay

## 2019-03-30 ENCOUNTER — Ambulatory Visit: Payer: Federal, State, Local not specified - PPO | Admitting: Family Medicine

## 2019-03-30 ENCOUNTER — Encounter: Payer: Self-pay | Admitting: Family Medicine

## 2019-03-30 VITALS — BP 128/80 | HR 98 | Ht 72.0 in | Wt 183.0 lb

## 2019-03-30 DIAGNOSIS — E1165 Type 2 diabetes mellitus with hyperglycemia: Secondary | ICD-10-CM

## 2019-03-30 LAB — BASIC METABOLIC PANEL
BUN: 14 mg/dL (ref 6–23)
CO2: 22 mEq/L (ref 19–32)
Calcium: 8.8 mg/dL (ref 8.4–10.5)
Chloride: 97 mEq/L (ref 96–112)
Creatinine, Ser: 1 mg/dL (ref 0.40–1.50)
GFR: 97.21 mL/min (ref >60.00)
Glucose, Bld: 329 mg/dL — ABNORMAL HIGH (ref 70–99)
Potassium: 4.1 mEq/L (ref 3.5–5.1)
Sodium: 132 mEq/L — ABNORMAL LOW (ref 135–145)

## 2019-03-30 LAB — MICROALBUMIN / CREATININE URINE RATIO
Creatinine,U: 96.9 mg/dL
Microalb Creat Ratio: 7.6 mg/g (ref 0.0–30.0)
Microalb, Ur: 7.3 mg/dL — ABNORMAL HIGH (ref 0.0–1.9)

## 2019-03-30 LAB — HEMOGLOBIN A1C: Hgb A1c MFr Bld: 16.5 % — ABNORMAL HIGH (ref 4.6–6.5)

## 2019-03-30 MED ORDER — METFORMIN HCL 1000 MG PO TABS: 1000.0000 mg | ORAL_TABLET | Freq: Two times a day (BID) | ORAL | 3 refills | Status: DC

## 2019-03-30 MED ORDER — OZEMPIC (1 MG/DOSE) 2 MG/1.5ML ~~LOC~~ SOPN: 0.5000 mg | PEN_INJECTOR | SUBCUTANEOUS | 4 refills | Status: DC

## 2019-03-30 NOTE — Progress Notes (Signed)
Established Patient Office Visit  Subjective:  Patient ID: Mark Fry, male    DOB: [birth year]  Age: 48 y.o. MRN: [id number]  CC:  Chief Complaint  Patient presents with  . Establish Care  . Diabetes  . Weight Loss    HPI Mark Fry presents for establishment of care and follow-up of his diabetes.  Have been diagnosed 6 years ago.  He had taken Glucophage and Levemir in the past.  Tells that Levemir was discontinued because he became controlled with the Glucophage alone.  He has been lost to follow-up for several years now.  He is more recently been losing weight.  Chart review shows that he had presented to the emergency room this past August and earlier this month with hyperglycemia.  He continues to take Glucophage daily 500 mg twice.  Patient has been mildly affected.  He is employed through the [employer].  He lives with his significant other.  He does not smoke drink alcohol or use illicit drugs.  He is active on his job and has a walking routine as well.  He does some weightlifting at home.  He has several unanswered questions about diabetes.  Nonfasting today.  He ate an apple  Past Medical History:  Diagnosis Date  . Diabetes mellitus without complication ([place])   . Nephrolithiasis     History reviewed. No pertinent surgical history.  History reviewed. No pertinent family history.  Social History   Socioeconomic History  . Marital status: Single    Spouse name: Not on file  . Number of children: Not on file  . Years of education: Not on file  . Highest education level: Not on file  Occupational History  . Not on file  Social Needs  . Financial resource strain: Not on file  . Food insecurity    Worry: Not on file    Inability: Not on file  . Transportation needs    Medical: Not on file    Non-medical: Not on file  Tobacco Use  . Smoking status: Never Smoker  . Smokeless tobacco: Never Used  Substance and Sexual Activity  . Alcohol use: No  . Drug use: No   . Sexual activity: Yes  Lifestyle  . Physical activity    Days per week: Not on file    Minutes per session: Not on file  . Stress: Not on file  Relationships  . Social Herbalist on phone: Not on file    Gets together: Not on file    Attends religious service: Not on file    Active member of club or organization: Not on file    Attends meetings of clubs or organizations: Not on file    Relationship status: Not on file  . Intimate partner violence    Fear of current or ex partner: Not on file    Emotionally abused: Not on file    Physically abused: Not on file    Forced sexual activity: Not on file  Other Topics Concern  . Not on file  Social History Narrative   Fhx:  Negative for Diabetes    Outpatient Medications Prior to Visit  Medication Sig Dispense Refill  . metFORMIN (GLUCOPHAGE) 500 MG tablet Take 1 tablet (500 mg total) by mouth 2 (two) times daily with a meal. 60 tablet 1  . blood glucose meter kit and supplies KIT Dispense based on patient and insurance preference. Use up to four times daily as directed. (FOR ICD-9  250.00, 250.01). 1 each 0  . Insulin Detemir (LEVEMIR) 100 UNIT/ML Pen Inject 25 Units into the skin daily at 10 pm. (Patient not taking: Reported on 05/12/2015) 15 mL 11  . Multiple Vitamin (MULTIVITAMIN) tablet Take 1 tablet by mouth daily.    . naproxen sodium (ALEVE) 220 MG tablet Take 220 mg by mouth 2 (two) times daily as needed (pain).    Marland Kitchen oxyCODONE-acetaminophen (PERCOCET/ROXICET) 5-325 MG tablet Take 1 tablet by mouth every 6 (six) hours as needed for severe pain. (Patient not taking: Reported on 01/09/2019) 20 tablet 0  . tamsulosin (FLOMAX) 0.4 MG CAPS capsule Take 1 capsule (0.4 mg total) by mouth daily. (Patient not taking: Reported on 01/09/2019) 4 capsule 0   No facility-administered medications prior to visit.     No Known Allergies  ROS Review of Systems  Constitutional: Negative.   HENT: Negative.   Eyes: Negative for  photophobia and visual disturbance.  Respiratory: Negative.   Cardiovascular: Negative.   Gastrointestinal: Negative.   Endocrine: Negative for polyphagia and polyuria.  Genitourinary: Negative.   Musculoskeletal: Negative.   Allergic/Immunologic: Negative for immunocompromised state.  Neurological: Negative for seizures and numbness.  Hematological: Negative.   Psychiatric/Behavioral: Negative.       Objective:    Physical Exam  Constitutional: He is oriented to person, place, and time. He appears well-developed and well-nourished. No distress.  HENT:  Head: Normocephalic and atraumatic.  Right Ear: External ear normal.  Left Ear: External ear normal.  Mouth/Throat: Oropharynx is clear and moist. No oropharyngeal exudate.  Eyes: Pupils are equal, round, and reactive to light. Conjunctivae are normal. Right eye exhibits no discharge. Left eye exhibits no discharge. No scleral icterus.  Neck: Neck supple. No JVD present. No tracheal deviation present. No thyromegaly present.  Cardiovascular: Normal rate, regular rhythm and normal heart sounds.  Pulmonary/Chest: Effort normal and breath sounds normal. No stridor.  Abdominal: Bowel sounds are normal.  Musculoskeletal:        General: No edema.  Lymphadenopathy:    He has no cervical adenopathy.  Neurological: He is alert and oriented to person, place, and time.  Skin: Skin is warm and dry. He is not diaphoretic.  Psychiatric: He has a normal mood and affect. His behavior is normal.   Diabetic Foot Exam - Simple   Simple Foot Form Diabetic Foot exam was performed with the following findings: Yes 03/30/2019 10:08 AM  Visual Inspection See comments: Yes Sensation Testing Intact to touch and monofilament testing bilaterally: Yes Pulse Check Posterior Tibialis and Dorsalis pulse intact bilaterally: Yes Comments  Feet are near pes planus bilaterally.  There are no lesions lesions or rashes.     BP 128/80   Pulse 98   Ht 6'  (1.829 m)   Wt 183 lb (83 kg)   SpO2 100%   BMI 24.82 kg/m  Wt Readings from Last 3 Encounters:  03/30/19 183 lb (83 kg)  01/09/19 210 lb (95.3 kg)  10/04/14 209 lb 3.2 oz (94.9 kg)   BP Readings from Last 3 Encounters:  03/30/19 128/80  03/17/19 115/71  01/09/19 (!) 150/99   Guideline developer:  UpToDate (see UpToDate for funding source) Date Released: June 2014  Health Maintenance Due  Topic Date Due  . OPHTHALMOLOGY EXAM  12/14/1982  . URINE MICROALBUMIN  12/14/1982  . HIV Screening  12/14/1987  . HEMOGLOBIN A1C  02/13/2015    There are no preventive care reminders to display for this patient.  Lab Results  Component Value Date   TSH 1.594 08/14/2014   Lab Results  Component Value Date   WBC 4.3 03/17/2019   HGB 15.6 03/17/2019   HCT 47.9 03/17/2019   MCV 81.3 03/17/2019   PLT 214 03/17/2019   Lab Results  Component Value Date   NA 136 03/17/2019   K 3.9 03/17/2019   CO2 18 (L) 03/17/2019   GLUCOSE 284 (H) 03/17/2019   BUN 21 (H) 03/17/2019   CREATININE 1.11 03/17/2019   BILITOT 0.9 01/09/2019   ALKPHOS 81 01/09/2019   AST 19 01/09/2019   ALT 38 01/09/2019   PROT 8.6 (H) 01/09/2019   ALBUMIN 4.3 01/09/2019   CALCIUM 8.5 (L) 03/17/2019   ANIONGAP 14 03/17/2019   No results found for: CHOL No results found for: HDL No results found for: LDLCALC No results found for: TRIG No results found for: CHOLHDL Lab Results  Component Value Date   HGBA1C 12.1 (H) 08/14/2014      Assessment & Plan:   Problem List Items Addressed This Visit      Endocrine   Type 2 diabetes mellitus with hyperglycemia, without long-term current use of insulin (HCC) - Primary   Relevant Medications   metFORMIN (GLUCOPHAGE) 1000 MG tablet   Semaglutide, 1 MG/DOSE, (OZEMPIC, 1 MG/DOSE,) 2 MG/1.5ML SOPN   Other Relevant Orders   Basic Metabolic Panel (BMET)   HgB A1c   Urine Microalbumin w/creat. ratio   Ambulatory referral to diabetic education      Meds ordered  this encounter  Medications  . metFORMIN (GLUCOPHAGE) 1000 MG tablet    Sig: Take 1 tablet (1,000 mg total) by mouth 2 (two) times daily with a meal.    Dispense:  180 tablet    Refill:  3  . Semaglutide, 1 MG/DOSE, (OZEMPIC, 1 MG/DOSE,) 2 MG/1.5ML SOPN    Sig: Inject 0.5 mg into the skin once a week.    Dispense:  4 pen    Refill:  4    Follow-up: Return in about 1 month (around 04/29/2019).   Patient declines vaccinations today because of mistrust.  He was given information on maintaining health through vaccinations.  We had a discussion about their safety and his need for vaccinations because of his apparent hyperglycemia and blunted immune response thereof.  Given information on diabetes meaning control.  Have referred to diabetic education.  Follow-up in 1 month.

## 2019-03-30 NOTE — Patient Instructions (Signed)
Type 2 Diabetes Mellitus, Self Care, Adult When you have type 2 diabetes (type 2 diabetes mellitus), you must make sure your blood sugar (glucose) stays in a healthy range. You can do this with:  Nutrition.  Exercise.  Lifestyle changes.  Medicines or insulin, if needed.  Support from your doctors and others. How to stay aware of blood sugar   Check your blood sugar level every day, as often as told.  Have your A1c (hemoglobin A1c) level checked two or more times a year. Have it checked more often if your doctor tells you to. Your doctor will set personal treatment goals for you. Generally, you should have these blood sugar levels:  Before meals (preprandial): 80-130 mg/dL (4.4-7.2 mmol/L).  After meals (postprandial): below 180 mg/dL (10 mmol/L).  A1c level: less than 7%. How to manage high and low blood sugar Signs of high blood sugar High blood sugar is called hyperglycemia. Know the signs of high blood sugar. Signs may include:  Feeling: ? Thirsty. ? Hungry. ? Very tired.  Needing to pee (urinate) more than usual.  Blurry vision. Signs of low blood sugar Low blood sugar is called hypoglycemia. This is when blood sugar is at or below 70 mg/dL (3.9 mmol/L). Signs may include:  Feeling: ? Hungry. ? Worried or nervous (anxious). ? Sweaty and clammy. ? Confused. ? Dizzy. ? Sleepy. ? Sick to your stomach (nauseous).  Having: ? A fast heartbeat. ? A headache. ? A change in your vision. ? Jerky movements that you cannot control (seizure). ? Tingling or no feeling (numbness) around your mouth, lips, or tongue.  Having trouble with: ? Moving (coordination). ? Sleeping. ? Passing out (fainting). ? Getting upset easily (irritability). Treating low blood sugar To treat low blood sugar, eat or drink something sugary right away. If you can think clearly and swallow safely, follow the 15:15 rule:  Take 15 grams of a fast-acting carb (carbohydrate). Talk with your  doctor about how much you should take.  Some fast-acting carbs are: ? Sugar tablets (glucose pills). Take 3-4 pills. ? 6-8 pieces of hard candy. ? 4-6 oz (120-150 mL) of fruit juice. ? 4-6 oz (120-150 mL) of regular (not diet) soda. ? 1 Tbsp (15 mL) honey or sugar.  Check your blood sugar 15 minutes after you take the carb.  If your blood sugar is still at or below 70 mg/dL (3.9 mmol/L), take 15 grams of a carb again.  If your blood sugar does not go above 70 mg/dL (3.9 mmol/L) after 3 tries, get help right away.  After your blood sugar goes back to normal, eat a meal or a snack within 1 hour. Treating very low blood sugar If your blood sugar is at or below 54 mg/dL (3 mmol/L), you have very low blood sugar (severe hypoglycemia). This is an emergency. Do not wait to see if the symptoms will go away. Get medical help right away. Call your local emergency services (911 in the [place] If you have very low blood sugar and you cannot eat or drink, you may need a glucagon shot (injection). A family member or friend should learn how to check your blood sugar and how to give you a glucagon shot. Ask your doctor if you need to have a glucagon shot kit at home. Follow these instructions at home: Medicine  Take insulin and diabetes medicines as told.  If your doctor says you should take more or less insulin and medicines, do this exactly as told.    Do not run out of insulin or medicines. Having diabetes can raise your risk for other long-term conditions. These include heart disease and kidney disease. Your doctor may prescribe medicines to help you not have these problems. Food   Make healthy food choices. These include: ? Chicken, fish, egg whites, and beans. ? Oats, whole wheat, bulgur, brown rice, quinoa, and millet. ? Fresh fruits and vegetables. ? Low-fat dairy products. ? Nuts, avocado, olive oil, and canola oil.  Meet with a food specialist (dietitian). He or she can help you make an  eating plan that is right for you.  Follow instructions from your doctor about what you cannot eat or drink.  Drink enough fluid to keep your pee (urine) pale yellow.  Keep track of carbs that you eat. Do this by reading food labels and learning food serving sizes.  Follow your sick day plan when you cannot eat or drink normally. Make this plan with your doctor so it is ready to use. Activity  Exercise 3 or more times a week.  Do not go more than 2 days without exercising.  Talk with your doctor before you start a new exercise. Your doctor may need to tell you to change: ? How much insulin or medicines you take. ? How much food you eat. Lifestyle  Do not use any tobacco products. These include cigarettes, chewing tobacco, and e-cigarettes. If you need help quitting, ask your doctor.  Ask your doctor how much alcohol is safe for you.  Learn to deal with stress. If you need help with this, ask your doctor. Body care   Stay up to date with your shots (immunizations).  Have your eyes and feet checked by a doctor as often as told.  Check your skin and feet every day. Check for cuts, bruises, redness, blisters, or sores.  Brush your teeth and gums two times a day. Floss one or more times a day.  Go to the dentist one or more times every 6 months.  Stay at a healthy weight. General instructions  Take over-the-counter and prescription medicines only as told by your doctor.  Share your diabetes care plan with: ? Your work or school. ? People you live with.  Carry a card or wear jewelry that says you have diabetes.  Keep all follow-up visits as told by your doctor. This is important. Questions to ask your doctor  Do I need to meet with a diabetes educator?  Where can I find a support group for people with diabetes? Where to find more information To learn more about diabetes, visit:  American Diabetes Association: www.diabetes.org  American Association of Diabetes  Educators: www.diabeteseducator.org Summary  When you have type 2 diabetes, you must make sure your blood sugar (glucose) stays in a healthy range.  Check your blood sugar every day, as often as told.  Having diabetes can raise your risk for other conditions. Your doctor may prescribe medicines to help you not have these problems.  Keep all follow-up visits as told by your doctor. This is important. This information is not intended to replace advice given to you by your health care provider. Make sure you discuss any questions you have with your health care provider. Document Released: 08/21/2015 Document Revised: 10/20/2017 Document Reviewed: 06/02/2015 Elsevier Patient Education  2020 [employer].  Preventing Disease Through Immunization Immunization means developing a lower risk of getting a disease due to improvements in the bodys disease-fighting system (immune system). Immunization can happen through:  [employer]  exposure to a disease.  Getting shots (vaccination). Vaccination involves putting a small amount of germs (vaccines) into the body. This may be done through one or more shots. Some vaccines can be given by mouth or as a nasal spray, instead of a shot. Vaccination helps to prevent:  Serious diseases such as polio, measles, and whooping cough.  Common infections, such as the flu. Vaccination starts at birth. Teens and adults also need vaccines regularly. Talk with your health care provider about the immunization schedule that is best for you. Some vaccines need to be repeated when you are older. How does immunization prevent disease? Immunization occurs when the body is exposed to germs that cause a certain disease. The body responds to this exposure by forming proteins (antibodies) to fight those germs. Germs in vaccines are dead or very weak, so they will not make you sick. However, the antibodies that your body makes will stay in your body for a long time. This improves the  ability of your immune system to fight the germs in the future. If you get exposed to the germs again, you may be able to resist them (develop immunity against them). This is because your antibodies may be able to destroy the germs before you get sick. Why should I prevent diseases through immunization? Vaccines can protect you from getting diseases that can cause harmful complications and even death. Getting vaccinated also helps to keep other people healthy. If you are vaccinated, you cannot spread disease to others, and that can make the disease become less common. If people keep getting vaccinated, certain diseases may become rare or go away. If people stop getting vaccinated, certain diseases could become more common. Not everyone can get a vaccine. Very young babies, people who are very sick, or older people may not be able to get vaccines. By getting immunized, you help to protect people who are not able to be vaccinated. Where to find more information To learn more about immunization, visit:  World Health Organization: https://www.davis-walter.com/  Centers for Disease Control and Prevention: http://www.murphy.com/ Summary  Immunization occurs when the body is exposed to germs that cause a certain disease and responds by forming proteins (antibodies) to fight those germs.  Getting vaccines is a safe and effective way to develop immunity against specific germs and the diseases that they cause.  Talk with your health care provider about your immunization schedule, and stay up to date with all of your shots. This information is not intended to replace advice given to you by your health care provider. Make sure you discuss any questions you have with your health care provider. Document Released: 06/21/2015 Document Revised: 08/21/2018 Document Reviewed: 01/06/2016 Elsevier Patient Education  2020 [employer].

## 2019-04-05 ENCOUNTER — Other Ambulatory Visit: Payer: Self-pay

## 2019-04-06 ENCOUNTER — Encounter: Payer: Self-pay | Admitting: Family Medicine

## 2019-04-06 ENCOUNTER — Ambulatory Visit (INDEPENDENT_AMBULATORY_CARE_PROVIDER_SITE_OTHER): Payer: Federal, State, Local not specified - PPO | Admitting: Family Medicine

## 2019-04-06 VITALS — BP 130/80 | HR 111 | Temp 96.8°F | Ht 72.0 in | Wt 181.0 lb

## 2019-04-06 DIAGNOSIS — N182 Chronic kidney disease, stage 2 (mild): Secondary | ICD-10-CM | POA: Diagnosis not present

## 2019-04-06 DIAGNOSIS — E1165 Type 2 diabetes mellitus with hyperglycemia: Secondary | ICD-10-CM | POA: Diagnosis not present

## 2019-04-06 LAB — BASIC METABOLIC PANEL
BUN: 14 mg/dL (ref 6–23)
CO2: 23 mEq/L (ref 19–32)
Calcium: 9.2 mg/dL (ref 8.4–10.5)
Chloride: 101 mEq/L (ref 96–112)
Creatinine, Ser: 0.96 mg/dL (ref 0.40–1.50)
GFR: 101.89 mL/min (ref >60.00)
Glucose, Bld: 317 mg/dL — ABNORMAL HIGH (ref 70–99)
Potassium: 3.9 mEq/L (ref 3.5–5.1)
Sodium: 136 mEq/L (ref 135–145)

## 2019-04-06 MED ORDER — INSULIN GLARGINE 100 UNITS/ML SOLOSTAR PEN: PEN_INJECTOR | SUBCUTANEOUS | 11 refills | Status: DC

## 2019-04-06 NOTE — Progress Notes (Signed)
Established Patient Office Visit  Subjective:  Patient ID: Mark Fry, male    DOB: 10/14/1970  Age: 48 y.o. MRN: [id number]  CC:  Chief Complaint  Patient presents with  . Follow-up    HPI Mark Fry presents for follow-up of his diabetes.  He was unable to fill the Ozempic.  He is taking the Glucophage at 1000 mg twice daily.  He is having some GI side effect.  Continues to lose weight but does not feel as though he has been urinating frequently.  He has taken insulin in the past when he was first diagnosed before he was lost to follow-up.  He has not gone for diabetic teaching yet.  Past Medical History:  Diagnosis Date  . Diabetes mellitus without complication ([place])   . Nephrolithiasis     History reviewed. No pertinent surgical history.  History reviewed. No pertinent family history.  Social History   Socioeconomic History  . Marital status: Single    Spouse name: Not on file  . Number of children: Not on file  . Years of education: Not on file  . Highest education level: Not on file  Occupational History  . Not on file  Social Needs  . Financial resource strain: Not on file  . Food insecurity    Worry: Not on file    Inability: Not on file  . Transportation needs    Medical: Not on file    Non-medical: Not on file  Tobacco Use  . Smoking status: Never Smoker  . Smokeless tobacco: Never Used  Substance and Sexual Activity  . Alcohol use: No  . Drug use: No  . Sexual activity: Yes  Lifestyle  . Physical activity    Days per week: Not on file    Minutes per session: Not on file  . Stress: Not on file  Relationships  . Social Herbalist on phone: Not on file    Gets together: Not on file    Attends religious service: Not on file    Active member of club or organization: Not on file    Attends meetings of clubs or organizations: Not on file    Relationship status: Not on file  . Intimate partner violence    Fear of current or ex partner: Not  on file    Emotionally abused: Not on file    Physically abused: Not on file    Forced sexual activity: Not on file  Other Topics Concern  . Not on file  Social History Narrative   Fhx:  Negative for Diabetes    Outpatient Medications Prior to Visit  Medication Sig Dispense Refill  . metFORMIN (GLUCOPHAGE) 1000 MG tablet Take 1 tablet (1,000 mg total) by mouth 2 (two) times daily with a meal. 180 tablet 3  . Semaglutide, 1 MG/DOSE, (OZEMPIC, 1 MG/DOSE,) 2 MG/1.5ML SOPN Inject 0.5 mg into the skin once a week. 4 pen 4   No facility-administered medications prior to visit.     No Known Allergies  ROS Review of Systems  Constitutional: Positive for fatigue and unexpected weight change.  Respiratory: Negative.   Cardiovascular: Negative.   Gastrointestinal: Negative.   Endocrine: Negative for polyphagia and polyuria.  Hematological: Does not bruise/bleed easily.  Psychiatric/Behavioral: Negative.       Objective:    Physical Exam  Constitutional: He is oriented to person, place, and time. He appears well-developed and well-nourished. No distress.  HENT:  Head: Normocephalic and atraumatic.  Right Ear: External ear normal.  Left Ear: External ear normal.  Eyes: Conjunctivae are normal. Right eye exhibits no discharge. Left eye exhibits no discharge. No scleral icterus.  Pulmonary/Chest: Effort normal.  Neurological: He is alert and oriented to person, place, and time.  Skin: He is not diaphoretic.  Psychiatric: He has a normal mood and affect. His behavior is normal.    BP 130/80   Pulse (!) 111   Temp (!) 96.8 F (36 C) (Tympanic)   Ht 6' (1.829 m)   Wt 181 lb (82.1 kg)   SpO2 97%   BMI 24.55 kg/m  Wt Readings from Last 3 Encounters:  04/06/19 181 lb (82.1 kg)  03/30/19 183 lb (83 kg)  01/09/19 210 lb (95.3 kg)   BP Readings from Last 3 Encounters:  04/06/19 130/80  03/30/19 128/80  03/17/19 115/71   Guideline developer:  UpToDate (see UpToDate for  funding source) Date Released: June 2014  Health Maintenance Due  Topic Date Due  . OPHTHALMOLOGY EXAM  12/14/1982  . HIV Screening  12/14/1987    There are no preventive care reminders to display for this patient.  Lab Results  Component Value Date   TSH 1.594 08/14/2014   Lab Results  Component Value Date   WBC 4.3 03/17/2019   HGB 15.6 03/17/2019   HCT 47.9 03/17/2019   MCV 81.3 03/17/2019   PLT 214 03/17/2019   Lab Results  Component Value Date   NA 132 (L) 03/30/2019   K 4.1 03/30/2019   CO2 22 03/30/2019   GLUCOSE 329 (H) 03/30/2019   BUN 14 03/30/2019   CREATININE 1.00 03/30/2019   BILITOT 0.9 01/09/2019   ALKPHOS 81 01/09/2019   AST 19 01/09/2019   ALT 38 01/09/2019   PROT 8.6 (H) 01/09/2019   ALBUMIN 4.3 01/09/2019   CALCIUM 8.8 03/30/2019   ANIONGAP 14 03/17/2019   GFR 97.21 03/30/2019   No results found for: CHOL No results found for: HDL No results found for: LDLCALC No results found for: TRIG No results found for: CHOLHDL Lab Results  Component Value Date   HGBA1C 16.5 (H) 03/30/2019      Assessment & Plan:   Problem List Items Addressed This Visit      Endocrine   Uncontrolled type 2 diabetes mellitus with hyperglycemia ([place]) - Primary   Relevant Medications   insulin glargine (LANTUS) 100 unit/mL SOPN   Other Relevant Orders   Basic Metabolic Panel (BMET)   C-peptide      Meds ordered this encounter  Medications  . insulin glargine (LANTUS) 100 unit/mL SOPN    Sig: Start with 10U injected tonight. Check fasting sugar tomorrow. Increase by 4U nightly until fasting sugars are < 150.    Dispense:  15 mL    Refill:  11    Please instruct. Needs to titrate insulin by 4 units nightly until fasting sugars are less than 150. Please do not ask me about an exact amount!    Follow-up: Return in about 1 month (around 05/06/2019).

## 2019-04-06 NOTE — Patient Instructions (Signed)
Insulin Glargine injection What is this medicine? INSULIN GLARGINE (IN su lin GLAR geen) is a human-made form of insulin. This drug lowers the amount of sugar in your blood. It is a long-acting insulin that is usually given once a day. This medicine may be used for other purposes; ask your health care provider or pharmacist if you have questions. COMMON BRAND NAME(S): BASAGLAR, Lantus, Lantus SoloStar, Toujeo Max SoloStar, Toujeo SoloStar What should I tell my health care provider before I take this medicine? They need to know if you have any of these conditions:  episodes of low blood sugar  eye disease, vision problems  kidney disease  liver disease  an unusual or allergic reaction to insulin, metacresol, other medicines, foods, dyes, or preservatives  pregnant or trying to get pregnant  breast-feeding How should I use this medicine? This medicine is for injection under the skin. Use this medicine at the same time each day. Use exactly as directed. This insulin should never be mixed in the same syringe with other insulins before injection. Do not vigorously shake before use. You will be taught how to use this medicine and how to adjust doses for activities and illness. Do not use more insulin than prescribed. Always check the appearance of your insulin before using it. This medicine should be clear and colorless like water. Do not use it if it is cloudy, thickened, colored, or has solid particles in it. If you use an insulin pen, be sure to take off the outer needle cover before using the dose. It is important that you put your used needles and syringes in a special sharps container. Do not put them in a trash can. If you do not have a sharps container, call your pharmacist or healthcare provider to get one. Talk to your pediatrician regarding the use of this medicine in children. While this drug may be prescribed for children as young as 6 years for selected conditions, precautions do  apply. Overdosage: If you think you have taken too much of this medicine contact a poison control center or emergency room at once. NOTE: This medicine is only for you. Do not share this medicine with others. What if I miss a dose? It is important not to miss a dose. Your health care professional or doctor should discuss a plan for missed doses with you. If you do miss a dose, follow their plan. Do not take double doses. What may interact with this medicine?  other medicines for diabetes Many medications may cause changes in blood sugar, these include:  alcohol containing beverages  antiviral medicines for HIV or AIDS  aspirin and aspirin-like drugs  certain medicines for blood pressure, heart disease, irregular heart beat  chromium  diuretics  male hormones, such as estrogens or progestins, birth control pills  fenofibrate  gemfibrozil  isoniazid  lanreotide  male hormones or anabolic steroids  MAOIs like Carbex, Eldepryl, Marplan, Nardil, and Parnate  medicines for weight loss  medicines for allergies, asthma, cold, or cough  medicines for depression, anxiety, or psychotic disturbances  niacin  nicotine  NSAIDs, medicines for pain and inflammation, like ibuprofen or naproxen  octreotide  pasireotide  pentamidine  phenytoin  probenecid  quinolone antibiotics such as ciprofloxacin, levofloxacin, ofloxacin  some herbal dietary supplements  steroid medicines such as prednisone or cortisone  sulfamethoxazole; trimethoprim  thyroid hormones Some medications can hide the warning symptoms of low blood sugar (hypoglycemia). You may need to monitor your blood sugar more closely if you  are taking one of these medications. These include:  beta-blockers, often used for high blood pressure or heart problems (examples include atenolol, metoprolol, propranolol)  clonidine  guanethidine  reserpine This list may not describe all possible interactions.  Give your health care provider a list of all the medicines, herbs, non-prescription drugs, or dietary supplements you use. Also tell them if you smoke, drink alcohol, or use illegal drugs. Some items may interact with your medicine. What should I watch for while using this medicine? Visit your health care professional or doctor for regular checks on your progress. Do not drive, use machinery, or do anything that needs mental alertness until you know how this medicine affects you. Alcohol may interfere with the effect of this medicine. Avoid alcoholic drinks. A test called the HbA1C (A1C) will be monitored. This is a simple blood test. It measures your blood sugar control over the last 2 to 3 months. You will receive this test every 3 to 6 months. Learn how to check your blood sugar. Learn the symptoms of low and high blood sugar and how to manage them. Always carry a quick-source of sugar with you in case you have symptoms of low blood sugar. Examples include hard sugar candy or glucose tablets. Make sure others know that you can choke if you eat or drink when you develop serious symptoms of low blood sugar, such as seizures or unconsciousness. They must get medical help at once. Tell your doctor or health care professional if you have high blood sugar. You might need to change the dose of your medicine. If you are sick or exercising more than usual, you might need to change the dose of your medicine. Do not skip meals. Ask your doctor or health care professional if you should avoid alcohol. Many nonprescription cough and cold products contain sugar or alcohol. These can affect blood sugar. Make sure that you have the right kind of syringe for the type of insulin you use. Try not to change the brand and type of insulin or syringe unless your health care professional or doctor tells you to. Switching insulin brand or type can cause dangerously high or low blood sugar. Always keep an extra supply of insulin,  syringes, and needles on hand. Use a syringe one time only. Throw away syringe and needle in a closed container to prevent accidental needle sticks. Insulin pens and cartridges should never be shared. Even if the needle is changed, sharing may result in passing of viruses like hepatitis or HIV. Each time you get a new box of pen needles, check to see if they are the same type as the ones you were trained to use. If not, ask your health care professional to show you how to use this new type properly. Wear a medical ID bracelet or chain, and carry a card that describes your disease and details of your medicine and dosage times. What side effects may I notice from receiving this medicine? Side effects that you should report to your doctor or health care professional as soon as possible:  allergic reactions like skin rash, itching or hives, swelling of the face, lips, or tongue  breathing problems  signs and symptoms of high blood sugar such as dizziness, dry mouth, dry skin, fruity breath, nausea, stomach pain, increased hunger or thirst, increased urination  signs and symptoms of low blood sugar such as feeling anxious, confusion, dizziness, increased hunger, unusually weak or tired, sweating, shakiness, cold, irritable, headache, blurred vision, fast heartbeat,  loss of consciousness Side effects that usually do not require medical attention (report to your doctor or health care professional if they continue or are bothersome):  increase or decrease in fatty tissue under the skin due to overuse of a particular injection site  itching, burning, swelling, or rash at site where injected This list may not describe all possible side effects. Call your doctor for medical advice about side effects. You may report side effects to FDA at 1-800-FDA-1088. Where should I keep my medicine? Keep out of the reach of children. Unopened Vials: Lantus vials: Store in a refrigerator between 2 and 8 degrees C (36  and 46 degrees F) or at room temperature below 30 degrees C (86 degrees F). Do not freeze or use if the insulin has been frozen. Protect from light and excessive heat. If stored at room temperature, the vial must be discarded after 28 days. Throw away any unopened and unused medicine that has been stored in the refrigerator after the expiration date. Unopened Pens: Neurosurgeon: Store in a refrigerator between 2 and 8 degrees C (36 and 46 degrees F) or at room temperature below 30 degrees C (86 degrees F). Do not freeze or use if the insulin has been frozen. Protect from light and excessive heat. If stored at room temperature, the pen must be discarded after 28 days. Throw away any unopened and unused medicine that has been stored in the refrigerator after the expiration date. Lantus Solostar Pens: Store in a refrigerator between 2 and 8 degrees C (36 and 46 degrees F) or at room temperature below 30 degrees C (86 degrees F). Do not freeze or use if the insulin has been frozen. Protect from light and excessive heat. If stored at room temperature, the pen must be discarded after 28 days. Throw away any unopened and unused medicine that has been stored in the refrigerator after the expiration date. Toujeo Solostar Pens or Toujeo Max [employer] Pens: Store in a refrigerator between 2 and 8 degrees C (36 and 46 degrees F). Do not freeze or use if the insulin has been frozen. Protect from light and excessive heat. Throw away any unopened and unused medicine that has been stored in the refrigerator after the expiration date. Vials that you are using: Lantus vials: Store in a refrigerator or at room temperature below 30 degrees C (86 degrees F). Do not freeze. Keep away from heat and light. Throw the opened vial away after 28 days. Pens that you are using: Basaglar KwikPens: Store at room temperature below 30 degrees C (86 degrees F). Do not refrigerate or freeze. Keep away from heat and light. Throw the pen  away after 28 days, even if it still has insulin left in it. Lantus Solostar Pens: Store at room temperature below 30 degrees C (86 degrees F). Do not refrigerate or freeze. Keep away from heat and light. Throw the pen away after 28 days, even if it still has insulin left in it. Toujeo Solostar Pens or Toujeo Max [employer] Pens: Store at room temperature below 30 degrees C (86 degrees F). Do not refrigerate or freeze. Keep away from heat and light. Throw the pen away after 56 days, even if it still has insulin left in it. NOTE: This sheet is a summary. It may not cover all possible information. If you have questions about this medicine, talk to your doctor, pharmacist, or health care provider.  2020 [employer]vier/Gold Standard (2018-08-04 12:28:36)

## 2019-04-07 LAB — C-PEPTIDE: C-Peptide: 1.13 ng/mL (ref 0.80–3.85)

## 2019-04-29 ENCOUNTER — Ambulatory Visit: Payer: Federal, State, Local not specified - PPO | Admitting: Family Medicine

## 2019-05-04 ENCOUNTER — Other Ambulatory Visit: Payer: Self-pay

## 2019-05-04 ENCOUNTER — Telehealth: Payer: Self-pay | Admitting: Family Medicine

## 2019-05-04 ENCOUNTER — Ambulatory Visit (INDEPENDENT_AMBULATORY_CARE_PROVIDER_SITE_OTHER): Payer: Federal, State, Local not specified - PPO | Admitting: Family Medicine

## 2019-05-04 ENCOUNTER — Encounter: Payer: Self-pay | Admitting: Family Medicine

## 2019-05-04 VITALS — BP 136/78 | HR 80 | Temp 98.1°F | Ht 72.0 in | Wt 192.2 lb

## 2019-05-04 DIAGNOSIS — E1165 Type 2 diabetes mellitus with hyperglycemia: Secondary | ICD-10-CM

## 2019-05-04 DIAGNOSIS — N182 Chronic kidney disease, stage 2 (mild): Secondary | ICD-10-CM

## 2019-05-04 LAB — BASIC METABOLIC PANEL
BUN: 10 mg/dL (ref 6–23)
CO2: 28 mEq/L (ref 19–32)
Calcium: 9.7 mg/dL (ref 8.4–10.5)
Chloride: 103 mEq/L (ref 96–112)
Creatinine, Ser: 1.02 mg/dL (ref 0.40–1.50)
GFR: 94.98 mL/min (ref >60.00)
Glucose, Bld: 175 mg/dL — ABNORMAL HIGH (ref 70–99)
Potassium: 4.1 mEq/L (ref 3.5–5.1)
Sodium: 137 mEq/L (ref 135–145)

## 2019-05-04 LAB — URINALYSIS, ROUTINE W REFLEX MICROSCOPIC
Bilirubin Urine: NEGATIVE
Hgb urine dipstick: NEGATIVE
Ketones, ur: NEGATIVE
Leukocytes,Ua: NEGATIVE
Nitrite: NEGATIVE
RBC / HPF: NONE SEEN (ref 0–?)
Specific Gravity, Urine: 1.025 (ref 1.000–1.030)
Total Protein, Urine: NEGATIVE
Urine Glucose: 500 — AB
Urobilinogen, UA: 0.2 (ref 0.0–1.0)
WBC, UA: NONE SEEN (ref 0–?)
pH: 6.5 (ref 5.0–8.0)

## 2019-05-04 NOTE — Telephone Encounter (Signed)
Disregard

## 2019-05-04 NOTE — Progress Notes (Signed)
Established Patient Office Visit  Subjective:  Patient ID: Mark Fry, male    DOB: Feb 21, 1971  Age: 48 y.o. MRN: [id number]  CC:  Chief Complaint  Patient presents with  . 1 month follow up visit    HPI Mark Fry presents for for follow-up of his uncontrolled diabetes, CKD.  Patient has been taking his Lantus and tells me that his fasting sugars have been in the less than 150 range and greater than 90 at this point.  He is not certain about the total dose time.  Continues to take the Metformin.  Scheduled for diabetic teaching in January.  Has not noticed improvement in his vision yet.  Past Medical History:  Diagnosis Date  . Diabetes mellitus without complication ([place])   . Nephrolithiasis     History reviewed. No pertinent surgical history.  History reviewed. No pertinent family history.  Social History   Socioeconomic History  . Marital status: Single    Spouse name: Not on file  . Number of children: Not on file  . Years of education: Not on file  . Highest education level: Not on file  Occupational History  . Not on file  Tobacco Use  . Smoking status: Never Smoker  . Smokeless tobacco: Never Used  Substance and Sexual Activity  . Alcohol use: No  . Drug use: No  . Sexual activity: Yes  Other Topics Concern  . Not on file  Social History Narrative   Fhx:  Negative for Diabetes   Social Determinants of Health   Financial Resource Strain:   . Difficulty of Paying Living Expenses: Not on file  Food Insecurity:   . Worried About Charity fundraiser in the Last Year: Not on file  . Ran Out of Food in the Last Year: Not on file  Transportation Needs:   . Lack of Transportation (Medical): Not on file  . Lack of Transportation (Non-Medical): Not on file  Physical Activity:   . Days of Exercise per Week: Not on file  . Minutes of Exercise per Session: Not on file  Stress:   . Feeling of Stress : Not on file  Social Connections:   . Frequency of  Communication with Friends and Family: Not on file  . Frequency of Social Gatherings with Friends and Family: Not on file  . Attends Religious Services: Not on file  . Active Member of Clubs or Organizations: Not on file  . Attends Archivist Meetings: Not on file  . Marital Status: Not on file  Intimate Partner Violence:   . Fear of Current or Ex-Partner: Not on file  . Emotionally Abused: Not on file  . Physically Abused: Not on file  . Sexually Abused: Not on file    Outpatient Medications Prior to Visit  Medication Sig Dispense Refill  . insulin glargine (LANTUS) 100 unit/mL SOPN Start with 10U injected tonight. Check fasting sugar tomorrow. Increase by 4U nightly until fasting sugars are < 150. 15 mL 11  . metFORMIN (GLUCOPHAGE) 1000 MG tablet Take 1 tablet (1,000 mg total) by mouth 2 (two) times daily with a meal. 180 tablet 3   No facility-administered medications prior to visit.    No Known Allergies  ROS Review of Systems  Constitutional: Negative.   Respiratory: Negative.   Cardiovascular: Negative.   Gastrointestinal: Negative.   Endocrine: Negative for polyphagia and polyuria.  Genitourinary: Negative.   Neurological: Negative for weakness.  Hematological: Does not bruise/bleed easily.  Psychiatric/Behavioral: Negative.  Objective:    Physical Exam  Constitutional: He is oriented to person, place, and time. He appears well-developed and well-nourished. No distress.  HENT:  Head: Normocephalic and atraumatic.  Right Ear: External ear normal.  Left Ear: External ear normal.  Eyes: Conjunctivae are normal. Right eye exhibits no discharge. Left eye exhibits no discharge. No scleral icterus.  Neck: No JVD present. No tracheal deviation present. No thyromegaly present.  Cardiovascular: Normal rate, regular rhythm and normal heart sounds.  Pulmonary/Chest: Effort normal and breath sounds normal. No stridor.  Musculoskeletal:     Cervical back:  Neck supple.  Lymphadenopathy:    He has no cervical adenopathy.  Neurological: He is alert and oriented to person, place, and time.  Skin: Skin is warm and dry. He is not diaphoretic.  Psychiatric: He has a normal mood and affect. His behavior is normal.    BP 136/78   Pulse 80   Temp 98.1 F (36.7 C)   Ht 6' (1.829 m)   Wt 192 lb 3.2 oz (87.2 kg)   SpO2 99%   BMI 26.07 kg/m  Wt Readings from Last 3 Encounters:  05/04/19 192 lb 3.2 oz (87.2 kg)  04/06/19 181 lb (82.1 kg)  03/30/19 183 lb (83 kg)     Health Maintenance Due  Topic Date Due  . OPHTHALMOLOGY EXAM  12/14/1982  . HIV Screening  12/14/1987    There are no preventive care reminders to display for this patient.  Lab Results  Component Value Date   TSH 1.594 08/14/2014   Lab Results  Component Value Date   WBC 4.3 03/17/2019   HGB 15.6 03/17/2019   HCT 47.9 03/17/2019   MCV 81.3 03/17/2019   PLT 214 03/17/2019   Lab Results  Component Value Date   NA 136 04/06/2019   K 3.9 04/06/2019   CO2 23 04/06/2019   GLUCOSE 317 (H) 04/06/2019   BUN 14 04/06/2019   CREATININE 0.96 04/06/2019   BILITOT 0.9 01/09/2019   ALKPHOS 81 01/09/2019   AST 19 01/09/2019   ALT 38 01/09/2019   PROT 8.6 (H) 01/09/2019   ALBUMIN 4.3 01/09/2019   CALCIUM 9.2 04/06/2019   ANIONGAP 14 03/17/2019   GFR 101.89 04/06/2019   No results found for: CHOL No results found for: HDL No results found for: LDLCALC No results found for: TRIG No results found for: CHOLHDL Lab Results  Component Value Date   HGBA1C 16.5 (H) 03/30/2019      Assessment & Plan:   Problem List Items Addressed This Visit      Endocrine   Type 2 diabetes mellitus with hyperglycemia, without long-term current use of insulin (HCC)   Relevant Orders   Basic Metabolic Panel (BMET)   Urinalysis, Routine w reflex microscopic   Uncontrolled type 2 diabetes mellitus with hyperglycemia ([place]) - Primary   Relevant Orders   Basic Metabolic Panel (BMET)     Urinalysis, Routine w reflex microscopic     Genitourinary   CKD (chronic kidney disease), stage II   Relevant Orders   Basic Metabolic Panel (BMET)   Urinalysis, Routine w reflex microscopic      No orders of the defined types were placed in this encounter.   Follow-up: Return in about 2 months (around 07/05/2019), or let us know what your total dose of lantus is at this point..  Again today patient declines flu shot and we discussed the importance of this vaccine as well as the Covid vaccine when it  becomes available to him.  Discussed the importance of addressing his lipid profile regarding his future health.  Patient will see if he can determine himself a the exact number of units he is taking of the Lantus or he will take the pen up to the pharmacy and get them detailed.  He will let me know at that point.  We discussed that it was important for me to know this for future refills.  Follow-up in 2 months and we will check his lipids and hemoglobin A1c again.  Libby Maw, MD

## 2019-07-05 ENCOUNTER — Other Ambulatory Visit: Payer: Self-pay

## 2019-07-06 ENCOUNTER — Encounter: Payer: Self-pay | Admitting: Family Medicine

## 2019-07-06 ENCOUNTER — Ambulatory Visit (INDEPENDENT_AMBULATORY_CARE_PROVIDER_SITE_OTHER): Payer: Federal, State, Local not specified - PPO | Admitting: Family Medicine

## 2019-07-06 VITALS — BP 138/88 | HR 90 | Temp 97.8°F | Ht 72.0 in | Wt 190.0 lb

## 2019-07-06 DIAGNOSIS — Z0000 Encounter for general adult medical examination without abnormal findings: Secondary | ICD-10-CM | POA: Diagnosis not present

## 2019-07-06 DIAGNOSIS — N182 Chronic kidney disease, stage 2 (mild): Secondary | ICD-10-CM | POA: Diagnosis not present

## 2019-07-06 DIAGNOSIS — E1165 Type 2 diabetes mellitus with hyperglycemia: Secondary | ICD-10-CM

## 2019-07-06 LAB — BASIC METABOLIC PANEL
BUN: 16 mg/dL (ref 6–23)
CO2: 27 mEq/L (ref 19–32)
Calcium: 9.6 mg/dL (ref 8.4–10.5)
Chloride: 100 mEq/L (ref 96–112)
Creatinine, Ser: 1.19 mg/dL (ref 0.40–1.50)
GFR: 79.44 mL/min (ref >60.00)
Glucose, Bld: 343 mg/dL — ABNORMAL HIGH (ref 70–99)
Potassium: 4.2 mEq/L (ref 3.5–5.1)
Sodium: 134 mEq/L — ABNORMAL LOW (ref 135–145)

## 2019-07-06 LAB — CBC
HCT: 44 % (ref 39.0–52.0)
Hemoglobin: 14.5 g/dL (ref 13.0–17.0)
MCHC: 32.9 g/dL (ref 30.0–36.0)
MCV: 81.3 fl (ref 78.0–100.0)
Platelets: 159 10*3/uL (ref 150.0–400.0)
RBC: 5.41 Mil/uL (ref 4.22–5.81)
RDW: 13.3 % (ref 11.5–15.5)
WBC: 2.7 10*3/uL — ABNORMAL LOW (ref 4.0–10.5)

## 2019-07-06 LAB — HEMOGLOBIN A1C: Hgb A1c MFr Bld: 12.6 % — ABNORMAL HIGH (ref 4.6–6.5)

## 2019-07-06 MED ORDER — METFORMIN HCL 1000 MG PO TABS: 1000.0000 mg | ORAL_TABLET | Freq: Two times a day (BID) | ORAL | 3 refills | Status: DC

## 2019-07-06 MED ORDER — INSULIN GLARGINE 100 UNITS/ML SOLOSTAR PEN: PEN_INJECTOR | SUBCUTANEOUS | 11 refills | Status: DC

## 2019-07-06 NOTE — Progress Notes (Signed)
Established Patient Office Visit  Subjective:  Patient ID: Mark Fry, male    DOB: Feb 16, 1971  Age: 49 y.o. MRN: [id number]  CC:  Chief Complaint  Patient presents with  . Follow-up    3 month follow up on diabetes. Patient have not taken insulin in a few weeks due to no lancets.     HPI Mark Fry presents for follow-up of his diabetes.  Continues to take his Metformin but has not taken his insulin in a couple of weeks because he ran out of needles.  He did not understand that he needed to asked the pharmacy for no more needles.  He tells me that he had taken his pen to the pharmacy and asked them to show him how to use it and he tells me that the pharmacist said that they did not know either.  He has not followed up for diabetic teaching he tells me because he cannot accept calls him where he works.  He was unable to tell me how many units of Lantus he had taken the last time he was able to use it.  Past Medical History:  Diagnosis Date  . Diabetes mellitus without complication ([place])   . Nephrolithiasis     History reviewed. No pertinent surgical history.  History reviewed. No pertinent family history.  Social History   Socioeconomic History  . Marital status: Single    Spouse name: Not on file  . Number of children: Not on file  . Years of education: Not on file  . Highest education level: Not on file  Occupational History  . Not on file  Tobacco Use  . Smoking status: Never Smoker  . Smokeless tobacco: Never Used  Substance and Sexual Activity  . Alcohol use: No  . Drug use: No  . Sexual activity: Yes  Other Topics Concern  . Not on file  Social History Narrative   Fhx:  Negative for Diabetes   Social Determinants of Health   Financial Resource Strain:   . Difficulty of Paying Living Expenses: Not on file  Food Insecurity:   . Worried About Charity fundraiser in the Last Year: Not on file  . Ran Out of Food in the Last Year: Not on file  Transportation  Needs:   . Lack of Transportation (Medical): Not on file  . Lack of Transportation (Non-Medical): Not on file  Physical Activity:   . Days of Exercise per Week: Not on file  . Minutes of Exercise per Session: Not on file  Stress:   . Feeling of Stress : Not on file  Social Connections:   . Frequency of Communication with Friends and Family: Not on file  . Frequency of Social Gatherings with Friends and Family: Not on file  . Attends Religious Services: Not on file  . Active Member of Clubs or Organizations: Not on file  . Attends Archivist Meetings: Not on file  . Marital Status: Not on file  Intimate Partner Violence:   . Fear of Current or Ex-Partner: Not on file  . Emotionally Abused: Not on file  . Physically Abused: Not on file  . Sexually Abused: Not on file    Outpatient Medications Prior to Visit  Medication Sig Dispense Refill  . metFORMIN (GLUCOPHAGE) 1000 MG tablet Take 1 tablet (1,000 mg total) by mouth 2 (two) times daily with a meal. 180 tablet 3  . insulin glargine (LANTUS) 100 unit/mL SOPN Start with 10U injected tonight. Check  fasting sugar tomorrow. Increase by 4U nightly until fasting sugars are < 150. (Patient not taking: Reported on 07/06/2019) 15 mL 11   No facility-administered medications prior to visit.    No Known Allergies  ROS Review of Systems  Constitutional: Negative.   HENT: Negative.   Respiratory: Negative.   Cardiovascular: Negative.   Gastrointestinal: Negative.   Endocrine: Negative for polyphagia and polyuria.  Genitourinary: Negative.   Musculoskeletal: Negative for gait problem and joint swelling.  Psychiatric/Behavioral: Negative.       Objective:    Physical Exam  Constitutional: He is oriented to person, place, and time. He appears well-developed and well-nourished. No distress.  HENT:  Head: Normocephalic and atraumatic.  Right Ear: External ear normal.  Left Ear: External ear normal.  Eyes: Conjunctivae are  normal. Right eye exhibits no discharge. Left eye exhibits no discharge. No scleral icterus.  Neck: No JVD present. No tracheal deviation present.  Cardiovascular: Normal rate, regular rhythm and normal heart sounds.  Pulmonary/Chest: Effort normal and breath sounds normal. No stridor.  Neurological: He is alert and oriented to person, place, and time.  Skin: He is not diaphoretic.  Psychiatric: He has a normal mood and affect. His behavior is normal.    BP 138/88   Pulse 90   Temp 97.8 F (36.6 C) (Tympanic)   Ht 6' (1.829 m)   Wt 190 lb (86.2 kg)   SpO2 98%   BMI 25.77 kg/m  Wt Readings from Last 3 Encounters:  07/06/19 190 lb (86.2 kg)  05/04/19 192 lb 3.2 oz (87.2 kg)  04/06/19 181 lb (82.1 kg)     Health Maintenance Due  Topic Date Due  . OPHTHALMOLOGY EXAM  12/14/1982  . HIV Screening  12/14/1987    There are no preventive care reminders to display for this patient.  Lab Results  Component Value Date   TSH 1.594 08/14/2014   Lab Results  Component Value Date   WBC 4.3 03/17/2019   HGB 15.6 03/17/2019   HCT 47.9 03/17/2019   MCV 81.3 03/17/2019   PLT 214 03/17/2019   Lab Results  Component Value Date   NA 137 05/04/2019   K 4.1 05/04/2019   CO2 28 05/04/2019   GLUCOSE 175 (H) 05/04/2019   BUN 10 05/04/2019   CREATININE 1.02 05/04/2019   BILITOT 0.9 01/09/2019   ALKPHOS 81 01/09/2019   AST 19 01/09/2019   ALT 38 01/09/2019   PROT 8.6 (H) 01/09/2019   ALBUMIN 4.3 01/09/2019   CALCIUM 9.7 05/04/2019   ANIONGAP 14 03/17/2019   GFR 94.98 05/04/2019   No results found for: CHOL No results found for: HDL No results found for: LDLCALC No results found for: TRIG No results found for: CHOLHDL Lab Results  Component Value Date   HGBA1C 16.5 (H) 03/30/2019      Assessment & Plan:   Problem List Items Addressed This Visit      Endocrine   Type 2 diabetes mellitus with hyperglycemia, without long-term current use of insulin (HCC)   Relevant  Medications   metFORMIN (GLUCOPHAGE) 1000 MG tablet   insulin glargine (LANTUS) 100 unit/mL SOPN   Uncontrolled type 2 diabetes mellitus with hyperglycemia (HCC) - Primary   Relevant Medications   metFORMIN (GLUCOPHAGE) 1000 MG tablet   insulin glargine (LANTUS) 100 unit/mL SOPN   Other Relevant Orders   CBC   Basic metabolic panel   Hemoglobin A1c   Ambulatory referral to diabetic education   Ambulatory referral to  Ophthalmology     Genitourinary   CKD (chronic kidney disease), stage II   Relevant Orders   Basic metabolic panel    Other Visit Diagnoses    Healthcare maintenance       Relevant Orders   HIV Antibody (routine testing w rflx)      Meds ordered this encounter  Medications  . metFORMIN (GLUCOPHAGE) 1000 MG tablet    Sig: Take 1 tablet (1,000 mg total) by mouth 2 (two) times daily with a meal.    Dispense:  180 tablet    Refill:  3  . insulin glargine (LANTUS) 100 unit/mL SOPN    Sig: Start with 10U injected tonight. Check fasting sugar tomorrow. Increase by 4U nightly until fasting sugars are < 150.    Dispense:  15 mL    Refill:  11    Please instruct. Needs to titrate insulin by 4 units nightly until fasting sugars are less than 150. Please do not ask me about an exact amount!    Follow-up: Return in about 3 months (around 10/03/2019).  We will also give him the number for diabetic teaching in order that he may call himself.  I gave him a new prescription for Lantus and asked him to use a different pharmacy if his current pharmacy could not help him on how to use it.  I also advised him that it was up to him to ask for the needed supplies.  If prescriptions are required the pharmacy can let me know and I will be glad to fill these for him.  Stressed the importance of diabetic teaching.  They can also help him understand medicines and treatment goals.  We will go ahead and refer for an eye check.  Unfortunately not expecting great improvement in his diabetic  control.    Libby Maw, MD

## 2019-07-07 LAB — HIV ANTIBODY (ROUTINE TESTING W REFLEX): HIV 1&2 Ab, 4th Generation: NONREACTIVE

## 2019-07-09 ENCOUNTER — Telehealth: Payer: Self-pay | Admitting: Family Medicine

## 2019-07-09 DIAGNOSIS — E1165 Type 2 diabetes mellitus with hyperglycemia: Secondary | ICD-10-CM

## 2019-07-09 NOTE — Telephone Encounter (Signed)
Patient is calling and stated that he went to pick up his insulin glargine but insurance doesn't cover medication. Pt is requesting if something else be sent to pharmacy. Pls advise. CB is [phone]

## 2019-07-12 NOTE — Progress Notes (Signed)
Unable to reach patient/mailed lab results/thx dmf

## 2019-07-13 NOTE — Telephone Encounter (Signed)
WK-Plz see pt message below/states that Insulin is not covered by his insurance and is requesting an alternative/plz advise/thx dmf

## 2019-07-14 MED ORDER — INSULIN GLARGINE 100 UNITS/ML SOLOSTAR PEN: PEN_INJECTOR | SUBCUTANEOUS | 11 refills | Status: DC

## 2019-07-14 NOTE — Telephone Encounter (Signed)
Resent Rx to pharm/requested that if PA needed to send information/thx dmf

## 2019-07-14 NOTE — Telephone Encounter (Signed)
Please check to see what insulins are covered.

## 2019-09-04 ENCOUNTER — Inpatient Hospital Stay (HOSPITAL_COMMUNITY)
Admission: EM | Admit: 2019-09-04 | Discharge: 2019-09-06 | DRG: 638 | Disposition: A | Discharge status: Home / Self Care | Payer: Federal, State, Local not specified - PPO | Attending: Internal Medicine | Admitting: Internal Medicine

## 2019-09-04 ENCOUNTER — Other Ambulatory Visit: Payer: Self-pay

## 2019-09-04 DIAGNOSIS — E860 Dehydration: Secondary | ICD-10-CM | POA: Diagnosis present

## 2019-09-04 DIAGNOSIS — E1165 Type 2 diabetes mellitus with hyperglycemia: Secondary | ICD-10-CM

## 2019-09-04 DIAGNOSIS — Z20822 Contact with and (suspected) exposure to covid-19: Secondary | ICD-10-CM | POA: Diagnosis present

## 2019-09-04 DIAGNOSIS — N179 Acute kidney failure, unspecified: Secondary | ICD-10-CM

## 2019-09-04 DIAGNOSIS — E1110 Type 2 diabetes mellitus with ketoacidosis without coma: Secondary | ICD-10-CM | POA: Diagnosis not present

## 2019-09-04 DIAGNOSIS — I10 Essential (primary) hypertension: Secondary | ICD-10-CM | POA: Diagnosis not present

## 2019-09-04 DIAGNOSIS — E0810 Diabetes mellitus due to underlying condition with ketoacidosis without coma: Secondary | ICD-10-CM | POA: Diagnosis not present

## 2019-09-04 DIAGNOSIS — T383X6A Underdosing of insulin and oral hypoglycemic [antidiabetic] drugs, initial encounter: Secondary | ICD-10-CM | POA: Diagnosis not present

## 2019-09-04 LAB — CBC WITH DIFFERENTIAL/PLATELET
Abs Immature Granulocytes: 0.01 10*3/uL (ref 0.00–0.07)
Basophils Absolute: 0 10*3/uL (ref 0.0–0.1)
Basophils Relative: 0 %
Eosinophils Absolute: 0 10*3/uL (ref 0.0–0.5)
Eosinophils Relative: 0 %
HCT: 53.4 % — ABNORMAL HIGH (ref 39.0–52.0)
Hemoglobin: 17.2 g/dL — ABNORMAL HIGH (ref 13.0–17.0)
Immature Granulocytes: 0 %
Lymphocytes Relative: 9 %
Lymphs Abs: 0.4 10*3/uL — ABNORMAL LOW (ref 0.7–4.0)
MCH: 26.1 pg (ref 26.0–34.0)
MCHC: 32.2 g/dL (ref 30.0–36.0)
MCV: 81 fL (ref 80.0–100.0)
Monocytes Absolute: 0.5 10*3/uL (ref 0.1–1.0)
Monocytes Relative: 11 %
Neutro Abs: 3.6 10*3/uL (ref 1.7–7.7)
Neutrophils Relative %: 80 %
Platelets: 212 10*3/uL (ref 150–400)
RBC: 6.59 MIL/uL — ABNORMAL HIGH (ref 4.22–5.81)
RDW: 14.9 % (ref 11.5–15.5)
WBC: 4.5 10*3/uL (ref 4.0–10.5)
nRBC: 0 % (ref 0.0–0.2)

## 2019-09-04 LAB — CBG MONITORING, ED
Glucose-Capillary: 445 mg/dL — ABNORMAL HIGH (ref 70–99)
Glucose-Capillary: 335 mg/dL — ABNORMAL HIGH (ref 70–99)
Glucose-Capillary: 404 mg/dL — ABNORMAL HIGH (ref 70–99)
Glucose-Capillary: 214 mg/dL — ABNORMAL HIGH (ref 70–99)
Glucose-Capillary: 201 mg/dL — ABNORMAL HIGH (ref 70–99)
Glucose-Capillary: 136 mg/dL — ABNORMAL HIGH (ref 70–99)
Glucose-Capillary: 116 mg/dL — ABNORMAL HIGH (ref 70–99)
Glucose-Capillary: 146 mg/dL — ABNORMAL HIGH (ref 70–99)
Glucose-Capillary: 185 mg/dL — ABNORMAL HIGH (ref 70–99)
Glucose-Capillary: 169 mg/dL — ABNORMAL HIGH (ref 70–99)

## 2019-09-04 LAB — URINALYSIS, ROUTINE W REFLEX MICROSCOPIC
Bacteria, UA: NONE SEEN
Bilirubin Urine: NEGATIVE
Glucose, UA: >=500 mg/dL — AB
Hgb urine dipstick: NEGATIVE
Ketones, ur: 80 mg/dL — AB
Leukocytes,Ua: NEGATIVE
Nitrite: NEGATIVE
Protein, ur: 30 mg/dL — AB
Specific Gravity, Urine: 1.03 (ref 1.005–1.030)
pH: 5 (ref 5.0–8.0)

## 2019-09-04 LAB — BASIC METABOLIC PANEL
Anion gap: 20 — ABNORMAL HIGH (ref 5–15)
Anion gap: 17 — ABNORMAL HIGH (ref 5–15)
Anion gap: 12 (ref 5–15)
Anion gap: 12 (ref 5–15)
BUN: 35 mg/dL — ABNORMAL HIGH (ref 6–20)
BUN: 31 mg/dL — ABNORMAL HIGH (ref 6–20)
BUN: 30 mg/dL — ABNORMAL HIGH (ref 6–20)
BUN: 28 mg/dL — ABNORMAL HIGH (ref 6–20)
CO2: 16 mmol/L — ABNORMAL LOW (ref 22–32)
CO2: 18 mmol/L — ABNORMAL LOW (ref 22–32)
CO2: 23 mmol/L (ref 22–32)
CO2: 22 mmol/L (ref 22–32)
Calcium: 10.1 mg/dL (ref 8.9–10.3)
Calcium: 10 mg/dL (ref 8.9–10.3)
Calcium: 9.8 mg/dL (ref 8.9–10.3)
Calcium: 9.4 mg/dL (ref 8.9–10.3)
Chloride: 103 mmol/L (ref 98–111)
Chloride: 107 mmol/L (ref 98–111)
Chloride: 110 mmol/L (ref 98–111)
Chloride: 107 mmol/L (ref 98–111)
Creatinine, Ser: 1.59 mg/dL — ABNORMAL HIGH (ref 0.61–1.24)
Creatinine, Ser: 1.54 mg/dL — ABNORMAL HIGH (ref 0.61–1.24)
Creatinine, Ser: 1.37 mg/dL — ABNORMAL HIGH (ref 0.61–1.24)
Creatinine, Ser: 1.31 mg/dL — ABNORMAL HIGH (ref 0.61–1.24)
GFR calc Af Amer: 59 mL/min — ABNORMAL LOW (ref >60)
GFR calc Af Amer: >60 mL/min (ref >60)
GFR calc Af Amer: >60 mL/min (ref >60)
GFR calc Af Amer: >60 mL/min (ref >60)
GFR calc non Af Amer: 51 mL/min — ABNORMAL LOW (ref >60)
GFR calc non Af Amer: 53 mL/min — ABNORMAL LOW (ref >60)
GFR calc non Af Amer: >60 mL/min (ref >60)
GFR calc non Af Amer: >60 mL/min (ref >60)
Glucose, Bld: 430 mg/dL — ABNORMAL HIGH (ref 70–99)
Glucose, Bld: 272 mg/dL — ABNORMAL HIGH (ref 70–99)
Glucose, Bld: 132 mg/dL — ABNORMAL HIGH (ref 70–99)
Glucose, Bld: 179 mg/dL — ABNORMAL HIGH (ref 70–99)
Potassium: 5 mmol/L (ref 3.5–5.1)
Potassium: 3.8 mmol/L (ref 3.5–5.1)
Potassium: 3.5 mmol/L (ref 3.5–5.1)
Potassium: 3.4 mmol/L — ABNORMAL LOW (ref 3.5–5.1)
Sodium: 139 mmol/L (ref 135–145)
Sodium: 142 mmol/L (ref 135–145)
Sodium: 145 mmol/L (ref 135–145)
Sodium: 141 mmol/L (ref 135–145)

## 2019-09-04 LAB — BLOOD GAS, VENOUS
Acid-base deficit: 7.3 mmol/L — ABNORMAL HIGH (ref 0.0–2.0)
Bicarbonate: 18.7 mmol/L — ABNORMAL LOW (ref 20.0–28.0)
O2 Saturation: 71.6 %
Patient temperature: 98.6
pCO2, Ven: 40.8 mmHg — ABNORMAL LOW (ref 44.0–60.0)
pH, Ven: 7.283 (ref 7.250–7.430)
pO2, Ven: 41 mmHg (ref 32.0–45.0)

## 2019-09-04 LAB — CBC
HCT: 50.6 % (ref 39.0–52.0)
Hemoglobin: 16.9 g/dL (ref 13.0–17.0)
MCH: 26.7 pg (ref 26.0–34.0)
MCHC: 33.4 g/dL (ref 30.0–36.0)
MCV: 79.8 fL — ABNORMAL LOW (ref 80.0–100.0)
Platelets: 221 10*3/uL (ref 150–400)
RBC: 6.34 MIL/uL — ABNORMAL HIGH (ref 4.22–5.81)
RDW: 14.7 % (ref 11.5–15.5)
WBC: 4.1 10*3/uL (ref 4.0–10.5)
nRBC: 0 % (ref 0.0–0.2)

## 2019-09-04 LAB — RESPIRATORY PANEL BY RT PCR (FLU A&B, COVID)
Influenza A by PCR: NEGATIVE
Influenza B by PCR: NEGATIVE
SARS Coronavirus 2 by RT PCR: NEGATIVE

## 2019-09-04 LAB — BETA-HYDROXYBUTYRIC ACID
Beta-Hydroxybutyric Acid: 5.86 mmol/L — ABNORMAL HIGH (ref 0.05–0.27)
Beta-Hydroxybutyric Acid: 2.68 mmol/L — ABNORMAL HIGH (ref 0.05–0.27)

## 2019-09-04 MED ORDER — ENOXAPARIN SODIUM 40 MG/0.4ML ~~LOC~~ SOLN
40.0000 mg | SUBCUTANEOUS | Status: DC
  Administered 2019-09-05 (×2): 40 mg via SUBCUTANEOUS
  Filled 2019-09-04 (×2): qty 0.4

## 2019-09-04 MED ORDER — ACETAMINOPHEN 650 MG RE SUPP: 650.0000 mg | Freq: Four times a day (QID) | RECTAL | Status: DC | PRN

## 2019-09-04 MED ORDER — SODIUM CHLORIDE 0.9 % IV SOLN: INTRAVENOUS | Status: DC

## 2019-09-04 MED ORDER — DEXTROSE-NACL 5-0.45 % IV SOLN: INTRAVENOUS | Status: DC

## 2019-09-04 MED ORDER — INSULIN REGULAR(HUMAN) IN NACL 100-0.9 UT/100ML-% IV SOLN
INTRAVENOUS | Status: DC
  Administered 2019-09-04: 15 [IU]/h via INTRAVENOUS
  Filled 2019-09-04: qty 100

## 2019-09-04 MED ORDER — SODIUM CHLORIDE 0.9 % IV BOLUS
1000.0000 mL | INTRAVENOUS | Status: AC
  Administered 2019-09-04: 1000 mL via INTRAVENOUS

## 2019-09-04 MED ORDER — ACETAMINOPHEN 325 MG PO TABS: 650.0000 mg | ORAL_TABLET | Freq: Four times a day (QID) | ORAL | Status: DC | PRN

## 2019-09-04 MED ORDER — ONDANSETRON HCL 4 MG/2ML IJ SOLN: 4.0000 mg | Freq: Four times a day (QID) | INTRAMUSCULAR | Status: DC | PRN

## 2019-09-04 MED ORDER — INSULIN ASPART 100 UNIT/ML ~~LOC~~ SOLN
0.0000 [IU] | SUBCUTANEOUS | Status: DC
  Administered 2019-09-05: 09:00:00 8 [IU] via SUBCUTANEOUS
  Administered 2019-09-05: 2 [IU] via SUBCUTANEOUS
  Administered 2019-09-05: 06:00:00 8 [IU] via SUBCUTANEOUS
  Filled 2019-09-04: qty 0.15

## 2019-09-04 MED ORDER — DEXTROSE 50 % IV SOLN: 0.0000 mL | INTRAVENOUS | Status: DC | PRN

## 2019-09-04 MED ORDER — ONDANSETRON HCL 4 MG PO TABS: 4.0000 mg | ORAL_TABLET | Freq: Four times a day (QID) | ORAL | Status: DC | PRN

## 2019-09-04 MED ORDER — INSULIN REGULAR(HUMAN) IN NACL 100-0.9 UT/100ML-% IV SOLN
INTRAVENOUS | Status: DC
  Administered 2019-09-04: 13:00:00 9 [IU]/h via INTRAVENOUS

## 2019-09-04 NOTE — ED Notes (Signed)
Urine culture sent down to lab with urinalysis. 

## 2019-09-04 NOTE — ED Triage Notes (Signed)
Pt c/o sts his blood sugar is high feeling weak and fatigue.

## 2019-09-04 NOTE — H&P (Signed)
History and Physical    Mark Fry [id number] DOB: 12-20-1970 DOA: 09/04/2019  PCP: Libby Maw, MD   Patient coming from: Home  I have personally briefly reviewed patient's old medical records in [place]  Chief Complaint: Increased blood sugars  HPI: Mark Fry is a 49 y.o. male with medical history significant of diabetes mellitus type 2 presented with generalized fatigue progressively worsening over the last few days.  Patient states that his blood sugars have been running very high in the 400s during the day and 300s during the night.  He has been compliant with his medication including Metformin and insulin.  Denies any fever, chills, worsening shortness of breath, chest pain, diarrhea, abdominal pain, rash.  He had some nausea and vomiting 3 days ago but none since then.  He feels very weak.  No recent steroid use.  Has not had Covid vaccine yet.  ED Course: He was found to be hyperglycemic with anion gap of 20.  He was started on IV fluids and insulin drip.  Hospitalist service was called to evaluate the patient.  Review of Systems: As per HPI otherwise all other systems were reviewed and are negative.   Past Medical History:  Diagnosis Date  . Diabetes mellitus without complication ([place])   . Nephrolithiasis     No past surgical history  Social history  reports that he has never smoked. He has never used smokeless tobacco. He reports that he does not drink alcohol or use drugs.  No Known Allergies  No history of cancer or TB  Prior to Admission medications   Medication Sig Start Date End Date Taking? Authorizing Provider  insulin glargine (LANTUS) 100 unit/mL SOPN Start with 10U injected tonight.Check FBS in am. Increase by 4Uqhs til FBS are < 150. Patient taking differently: Inject 15 Units into the skin at bedtime.  07/14/19  Yes Libby Maw, MD  metFORMIN (GLUCOPHAGE) 1000 MG tablet Take 1 tablet (1,000 mg total) by mouth 2 (two) times  daily with a meal. 07/06/19  Yes Libby Maw, MD    Physical Exam: Vitals:   09/04/19 0626 09/04/19 0903 09/04/19 1115  BP: 133/89 (!) 126/94 (!) 142/98  Pulse: (!) 125 (!) 117 (!) 108  Resp: 18 18 (!) 28  Temp: 98.5 F (36.9 C)    TempSrc: Oral    SpO2: 99% 100% 98%  Weight: 86.2 kg    Height: 6' (1.829 m)      Constitutional: NAD, calm, comfortable Vitals:   09/04/19 0626 09/04/19 0903 09/04/19 1115  BP: 133/89 (!) 126/94 (!) 142/98  Pulse: (!) 125 (!) 117 (!) 108  Resp: 18 18 (!) 28  Temp: 98.5 F (36.9 C)    TempSrc: Oral    SpO2: 99% 100% 98%  Weight: 86.2 kg    Height: 6' (1.829 m)     Eyes: PERRL, lids and conjunctivae normal ENMT: Mucous membranes are dry. Posterior pharynx clear of any exudate or lesions. Neck: normal, supple, no masses, no thyromegaly Respiratory: bilateral decreased breath sounds at bases, no wheezing, no crackles.  Intermittently tachypneic.  No accessory muscle use.  Cardiovascular: S1 S2 positive, tachycardic, no extremity edema. 2+ pedal pulses.  Abdomen: no tenderness, no masses palpated. No hepatosplenomegaly. Bowel sounds positive.  Musculoskeletal: no clubbing / cyanosis. No joint deformity upper and lower extremities.  Skin: no rashes, lesions, ulcers. No induration Neurologic: CN 2-12 grossly intact. Moving extremities. No focal neurologic deficits.  Psychiatric: Normal judgment and insight. Alert  and oriented x 3. Normal mood.    Labs on Admission: I have personally reviewed following labs and imaging studies  CBC: Recent Labs  Lab 09/04/19 1010  WBC 4.5  NEUTROABS 3.6  HGB 17.2*  HCT 53.4*  MCV 81.0  PLT 99991111   Basic Metabolic Panel: Recent Labs  Lab 09/04/19 1010  NA 139  K 5.0  CL 103  CO2 16*  GLUCOSE 430*  BUN 35*  CREATININE 1.59*  CALCIUM 10.1   GFR: Estimated Creatinine Clearance: 63.7 mL/min (A) (by C-G formula based on SCr of 1.59 mg/dL (H)). Liver Function Tests: No results for input(s):  AST, ALT, ALKPHOS, BILITOT, PROT, ALBUMIN in the last 168 hours. No results for input(s): LIPASE, AMYLASE in the last 168 hours. No results for input(s): AMMONIA in the last 168 hours. Coagulation Profile: No results for input(s): INR, PROTIME in the last 168 hours. Cardiac Enzymes: No results for input(s): CKTOTAL, CKMB, CKMBINDEX, TROPONINI in the last 168 hours. BNP (last 3 results) No results for input(s): PROBNP in the last 8760 hours. HbA1C: No results for input(s): HGBA1C in the last 72 hours. CBG: Recent Labs  Lab 09/04/19 0641 09/04/19 1153  GLUCAP 445* 404*   Lipid Profile: No results for input(s): CHOL, HDL, LDLCALC, TRIG, CHOLHDL, LDLDIRECT in the last 72 hours. Thyroid Function Tests: No results for input(s): TSH, T4TOTAL, FREET4, T3FREE, THYROIDAB in the last 72 hours. Anemia Panel: No results for input(s): VITAMINB12, FOLATE, FERRITIN, TIBC, IRON, RETICCTPCT in the last 72 hours. Urine analysis:    Component Value Date/Time   COLORURINE YELLOW 09/04/2019 1010   APPEARANCEUR CLEAR 09/04/2019 1010   LABSPEC 1.030 09/04/2019 1010   PHURINE 5.0 09/04/2019 1010   GLUCOSEU >=500 (A) 09/04/2019 1010   GLUCOSEU 500 (A) 05/04/2019 1007   HGBUR NEGATIVE 09/04/2019 1010   BILIRUBINUR NEGATIVE 09/04/2019 1010   KETONESUR 80 (A) 09/04/2019 1010   PROTEINUR 30 (A) 09/04/2019 1010   UROBILINOGEN 0.2 05/04/2019 1007   NITRITE NEGATIVE 09/04/2019 1010   LEUKOCYTESUR NEGATIVE 09/04/2019 1010    Radiological Exams on Admission: No results found.   Assessment/Plan  DKA in a patient with history of diabetes mellitus type 2 -Presented with hyperglycemia with anion gap of 20.  Received IV fluid bolus in the ED and has been started on insulin drip. -Continue insulin drip.  Continue current IV fluids and change to D5 half-normal saline once CBGs are below 250.  Check frequent CBGs.  Once anion gap is closed, transition to long-acting insulin. -Diabetes coordinator consult.   A1c was 12.6 on 07/06/2019 -N.p.o. except for sips of meds/water for now  Acute kidney injury Dehydration -From above -IV fluids as above    DVT prophylaxis: Lovenox Code Status: Full Family Communication: None at bedside Disposition Plan: Home in 1 to 2 days once clinically improved Consults called: None Admission status: Observation/Stepdown  Severity of Illness: The appropriate patient status for this patient is OBSERVATION. Observation status is judged to be reasonable and necessary in order to provide the required intensity of service to ensure the patient's safety. The patient's presenting symptoms, physical exam findings, and initial radiographic and laboratory data in the context of their medical condition is felt to place them at decreased risk for further clinical deterioration. Furthermore, it is anticipated that the patient will be medically stable for discharge from the hospital within 2 midnights of admission. The following factors support the patient status of observation.   " The patient's presenting symptoms include generalized weakness/hyperglycemia. "  The physical exam findings include tachycardia/dry oral mucosa. " The initial radiographic and laboratory data are DKA.     Aline August MD [facility]  09/04/2019, 12:12 PM

## 2019-09-04 NOTE — ED Notes (Signed)
Report attempted to be called was advised that nurse was in with a patient and would call back when finished.

## 2019-09-04 NOTE — Progress Notes (Addendum)
Inpatient Diabetes Program Recommendations  AACE/ADA: New Consensus Statement on Inpatient Glycemic Control (2015)  Target Ranges:  Prepandial:   less than 140 mg/dL      Peak postprandial:   less than 180 mg/dL (1-2 hours)      Critically ill patients:  140 - 180 mg/dL   Results for [name], [name] (MRN [id number]) as of 09/04/2019 13:28  Ref. Range 09/04/2019 06:41 09/04/2019 11:53 09/04/2019 12:20  Glucose-Capillary Latest Ref Range: 70 - 99 mg/dL 445 (H) 404 (H)  IV Insulin Drip Started 335 (H)  IV Insulin Drip   Results for [name], [name] (MRN [id number]) as of 09/04/2019 13:28  Ref. Range 09/04/2019 10:10  Sodium Latest Ref Range: 135 - 145 mmol/L 139  Potassium Latest Ref Range: 3.5 - 5.1 mmol/L 5.0  Chloride Latest Ref Range: 98 - 111 mmol/L 103  CO2 Latest Ref Range: 22 - 32 mmol/L 16 (L)  Glucose Latest Ref Range: 70 - 99 mg/dL 430 (H)  BUN Latest Ref Range: 6 - 20 mg/dL 35 (H)  Creatinine Latest Ref Range: 0.61 - 1.24 mg/dL 1.59 (H)  Calcium Latest Ref Range: 8.9 - 10.3 mg/dL 10.1  Anion gap Latest Ref Range: 5 - 15  20 (H)   Results for [name], [name] (MRN [id number]) as of 09/04/2019 13:28  Ref. Range 03/30/2019 10:05 07/06/2019 09:24  Hemoglobin A1C Latest Ref Range: 4.6 - 6.5 % 16.5 (H) 12.6 (H)    Admit with: DKA  History: DM  Home DM Meds: Lantus 15 units Daily       Metformin 1000 mg BID  Current Orders: IV Insulin Drip    PCP: Dr. Abelino Derrick with [facility]--last seen 07/06/2019--A1c at that visit was 12.6%--Told PCP at that visit that he hadn't taken insulin in a few weeks prior to that visit b/c he stated he ran out of insulin pen needles--telephone conversation between PCP office and pt on 07/09/2019 showed that pt stated insurance was not covering Lantus insulin--Unsure what outcome was     MD- Please leave pt on the IV Insulin Drip until CBGs stable/ CO2 level 20 or higher/ Anion Gap 12 or less  Please order Current A1c level so we can see if  pt has made any improvement since February  When BMET improved and pt ready to transition to SQ Insulin, please make sure pt receives his home dose of Lantus at least 1 hour prior to d/c of the IV Insulin Drip     --Will follow patient during hospitalization--  Wyn Quaker RN, MSN, CDE Diabetes Coordinator Inpatient Glycemic Control Team Team Pager: [phone] (8a-5p)

## 2019-09-04 NOTE — ED Provider Notes (Signed)
[facility] DEPT Provider Note   CSN: [id number] Arrival date & time: 09/04/19  [id number]     History Chief Complaint  Patient presents with  . Hyperglycemia    Mark Fry is a 49 y.o. male.  The history is provided by the patient and medical records. No language interpreter was used.  Hyperglycemia    49 year old male with history of uncontrolled diabetes, hypertension, polycythemia, chronic kidney disease presenting complaining of generalized fatigue.  Patient reports for the past 3 days he has been feeling increasingly more tired than usual and also noticed that his blood sugar has been running high.  States that he checks at home and usually during the day his CBG is in the 400s and at nighttime it is in the 300s.  He has been compliant with his medication which includes Metformin and insulin but noticed no improvement.  He denies any associated fever or chills.  He did endorse nausea and vomiting 3 days ago but none since.  No polyuria or polydipsia.  No runny nose sneezing or coughing no chest pain shortness of breath abdominal pain no diarrhea constipation, dysuria or rash.  No recent sickness no contributing factor.  He has not had his Covid vaccine.  No steroid use.  Past Medical History:  Diagnosis Date  . Diabetes mellitus without complication ([place])   . Nephrolithiasis     Patient Active Problem List   Diagnosis Date Noted  . Uncontrolled type 2 diabetes mellitus with hyperglycemia ([place]) 04/06/2019  . Type 2 diabetes mellitus with hyperglycemia, without long-term current use of insulin ([place]) 03/30/2019  . Hyperglycemia 08/14/2014  . Hypertension 08/14/2014  . Polycythemia 08/14/2014  . CKD (chronic kidney disease), stage II 08/14/2014  . Diabetes mellitus, new onset ([place]) 08/14/2014    No past surgical history on file.     No family history on file.  Social History   Tobacco Use  . Smoking status: Never Smoker  . Smokeless tobacco:  Never Used  Substance Use Topics  . Alcohol use: No  . Drug use: No    Home Medications Prior to Admission medications   Medication Sig Start Date End Date Taking? Authorizing Provider  insulin glargine (LANTUS) 100 unit/mL SOPN Start with 10U injected tonight.Check FBS in am. Increase by 4Uqhs til FBS are < 150. Patient taking differently: Inject 15 Units into the skin at bedtime.  07/14/19  Yes Libby Maw, MD  metFORMIN (GLUCOPHAGE) 1000 MG tablet Take 1 tablet (1,000 mg total) by mouth 2 (two) times daily with a meal. 07/06/19  Yes Libby Maw, MD    Allergies    Patient has no known allergies.  Review of Systems   Review of Systems  All other systems reviewed and are negative.   Physical Exam Updated Vital Signs BP (!) 126/94   Pulse (!) 117   Temp 98.5 F (36.9 C) (Oral)   Resp 18   Ht 6' (1.829 m)   Wt 86.2 kg   SpO2 100%   BMI 25.77 kg/m   Physical Exam Vitals and nursing note reviewed.  Constitutional:      General: He is not in acute distress.    Appearance: He is well-developed.  HENT:     Head: Atraumatic.  Eyes:     Conjunctiva/sclera: Conjunctivae normal.  Cardiovascular:     Rate and Rhythm: Tachycardia present.     Pulses: Normal pulses.  Pulmonary:     Effort: Pulmonary effort is normal.  Breath sounds: No wheezing or rales.  Abdominal:     Palpations: Abdomen is soft.     Tenderness: There is no abdominal tenderness.  Musculoskeletal:     Cervical back: Neck supple.  Skin:    Findings: No rash.  Neurological:     Mental Status: He is alert and oriented to person, place, and time.  Psychiatric:        Mood and Affect: Mood normal.     ED Results / Procedures / Treatments   Labs (all labs ordered are listed, but only abnormal results are displayed) Labs Reviewed  BASIC METABOLIC PANEL - Abnormal; Notable for the following components:      Result Value   CO2 16 (*)    Glucose, Bld 430 (*)    BUN 35 (*)     Creatinine, Ser 1.59 (*)    GFR calc non Af Amer 51 (*)    GFR calc Af Amer 59 (*)    Anion gap 20 (*)    All other components within normal limits  CBC WITH DIFFERENTIAL/PLATELET - Abnormal; Notable for the following components:   RBC 6.59 (*)    Hemoglobin 17.2 (*)    HCT 53.4 (*)    Lymphs Abs 0.4 (*)    All other components within normal limits  URINALYSIS, ROUTINE W REFLEX MICROSCOPIC - Abnormal; Notable for the following components:   Glucose, UA >=500 (*)    Ketones, ur 80 (*)    Protein, ur 30 (*)    All other components within normal limits  CBG MONITORING, ED - Abnormal; Notable for the following components:   Glucose-Capillary 445 (*)    All other components within normal limits  RESPIRATORY PANEL BY RT PCR (FLU A&B, COVID)  BLOOD GAS, VENOUS    EKG None  Radiology No results found.  Procedures .Critical Care Performed by: Domenic Moras, PA-C Authorized by: Domenic Moras, PA-C   Critical care provider statement:    Critical care time (minutes):  32   Critical care was time spent personally by me on the following activities:  Discussions with consultants, evaluation of patient's response to treatment, examination of patient, ordering and performing treatments and interventions, ordering and review of laboratory studies, ordering and review of radiographic studies, pulse oximetry, re-evaluation of patient's condition, obtaining history from patient or surrogate and review of old charts   (including critical care time)  Medications Ordered in ED Medications  insulin regular, human (MYXREDLIN) 100 units/ 100 mL infusion (has no administration in time range)  0.9 %  sodium chloride infusion ( Intravenous New Bag/Given 09/04/19 1147)  dextrose 5 %-0.45 % sodium chloride infusion (has no administration in time range)  dextrose 50 % solution 0-50 mL (has no administration in time range)  sodium chloride 0.9 % bolus 1,000 mL (1,000 mLs Intravenous New Bag/Given 09/04/19  [id number])    ED Course  I have reviewed the triage vital signs and the nursing notes.  Pertinent labs & imaging results that were available during my care of the patient were reviewed by me and considered in my medical decision making (see chart for details).    MDM Rules/Calculators/A&P                      BP (!) 142/98   Pulse (!) 108   Temp 98.5 F (36.9 C) (Oral)   Resp (!) 28   Ht 6' (1.829 m)   Wt 86.2 kg   SpO2 98%  BMI 25.77 kg/m   Final Clinical Impression(s) / ED Diagnoses Final diagnoses:  Diabetic ketoacidosis without coma associated with type 2 diabetes mellitus ([place])    Rx / DC Orders ED Discharge Orders    None     9:15 AM Patient with history of poorly controlled diabetes here with complaints of generalized fatigue.  Initial CBG is 445.  Patient states he has been compliant with his medication.  Patient found to be tachycardic with a heart rate of 117.  Patient otherwise well-appearing and resting comfortably.  No abdominal pain.  Work-up initiated.  Will initiate glucose stabilizer for CBG control.  No prior history of DKA.  11:56 AM Lab is consistent with DKA as patient has an anion gap of 20, ketones of 80 and CBG of 430.  Mild AKI with BUN 35, creatinine 1.59.  Patient is hemoconcentrated with a hemoglobin of 17.2.  He is currently receiving IV fluid as well as glucose stabilizer for his lactic acidosis.  Will obtain screening COVID-19 test, and will consult for admission for DKA.  12:05 PM Appreciate consultation from [facility] Dr. Starla Link who agrees to see and admit pt for further management.    Mark Fry was evaluated in Emergency Department on 09/04/2019 for the symptoms described in the history of present illness. He was evaluated in the context of the global COVID-19 pandemic, which necessitated consideration that the patient might be at risk for infection with the SARS-CoV-2 virus that causes COVID-19. Institutional protocols and algorithms  that pertain to the evaluation of patients at risk for COVID-19 are in a state of rapid change based on information released by regulatory bodies including the CDC and federal and state organizations. These policies and algorithms were followed during the patient's care in the ED.    Domenic Moras, PA-C 09/04/19 1206    Davonna Belling, MD 09/04/19 1350

## 2019-09-04 NOTE — ED Notes (Signed)
ED TO INPATIENT HANDOFF REPORT  Name/Age/Gender Mark Fry 49 y.o. male  Code Status    Code Status Orders  (From admission, onward)         Start     Ordered   09/04/19 1208  Full code  Continuous     09/04/19 1212        Code Status History    Date Active Date Inactive Code Status Order ID Comments User Context   08/14/2014 2241 08/16/2014 1935 Full Code [id number]  Jani Gravel, MD Inpatient   Advance Care Planning Activity      Home/SNF/Other Home  Chief Complaint DKA (diabetic ketoacidoses) ([place]) [E11.10]  Level of Care/Admitting Diagnosis ED Disposition    ED Disposition Condition [facility] Area: [place] [[id number]]  Level of Care: Telemetry [5]  Admit to tele based on following criteria: Monitor for Ischemic changes  Covid Evaluation: Confirmed COVID Negative  Diagnosis: D[facility]diabetic ketoacidoses) [id number]  Admitting Physician: Burney Gauze  Attending Physician: Aline August [id number]  Estimated length of stay: 3 - 4 days  Certification:: I certify this patient will need inpatient services for at least 2 midnights       Medical History Past Medical History:  Diagnosis Date  . Diabetes mellitus without complication ([place])   . Nephrolithiasis     Allergies No Known Allergies  IV Location/Drains/Wounds Patient Lines/Drains/Airways Status   Active Line/Drains/Airways    Name:   Placement date:   Placement time:   Site:   Days:   Peripheral IV 03/17/19 Right Antecubital   03/17/19    1425    Antecubital   171   Peripheral IV 09/04/19 Left Hand   09/04/19    0941    Hand   less than 1          Labs/Imaging Results for orders placed or performed during the hospital encounter of 09/04/19 (from the past 48 hour(s))  CBG monitoring, ED     Status: Abnormal   Collection Time: 09/04/19  6:41 AM  Result Value Ref Range   Glucose-Capillary 445 (H) 70 - 99 mg/dL    Comment: Glucose reference range applies  only to samples taken after fasting for at least 8 hours.  Basic metabolic panel     Status: Abnormal   Collection Time: 09/04/19 10:10 AM  Result Value Ref Range   Sodium 139 135 - 145 mmol/L   Potassium 5.0 3.5 - 5.1 mmol/L   Chloride 103 98 - 111 mmol/L   CO2 16 (L) 22 - 32 mmol/L   Glucose, Bld 430 (H) 70 - 99 mg/dL    Comment: Glucose reference range applies only to samples taken after fasting for at least 8 hours.   BUN 35 (H) 6 - 20 mg/dL   Creatinine, Ser 1.59 (H) 0.61 - 1.24 mg/dL   Calcium 10.1 8.9 - 10.3 mg/dL   GFR calc non Af Amer 51 (L) >60 mL/min   GFR calc Af Amer 59 (L) >60 mL/min   Anion gap 20 (H) 5 - 15    Comment: Performed at [facility], [place] [address], [place], [place] [address]  CBC with Differential (PNL)     Status: Abnormal   Collection Time: 09/04/19 10:10 AM  Result Value Ref Range   WBC 4.5 4.0 - 10.5 K/uL   RBC 6.59 (H) 4.22 - 5.81 MIL/uL   Hemoglobin 17.2 (H) 13.0 - 17.0 g/dL   HCT 53.4 (  H) 39.0 - 52.0 %   MCV 81.0 80.0 - 100.0 fL   MCH 26.1 26.0 - 34.0 pg   MCHC 32.2 30.0 - 36.0 g/dL   RDW 14.9 11.5 - 15.5 %   Platelets 212 150 - 400 K/uL   nRBC 0.0 0.0 - 0.2 %   Neutrophils Relative % 80 %   Neutro Abs 3.6 1.7 - 7.7 K/uL   Lymphocytes Relative 9 %   Lymphs Abs 0.4 (L) 0.7 - 4.0 K/uL   Monocytes Relative 11 %   Monocytes Absolute 0.5 0.1 - 1.0 K/uL   Eosinophils Relative 0 %   Eosinophils Absolute 0.0 0.0 - 0.5 K/uL   Basophils Relative 0 %   Basophils Absolute 0.0 0.0 - 0.1 K/uL   Immature Granulocytes 0 %   Abs Immature Granulocytes 0.01 0.00 - 0.07 K/uL    Comment: Performed at [facility], [place] [address] [place], [place] [address]  Urinalysis, Routine w reflex microscopic     Status: Abnormal   Collection Time: 09/04/19 10:10 AM  Result Value Ref Range   Color, Urine YELLOW YELLOW   APPearance CLEAR CLEAR   Specific Gravity, Urine 1.030 1.005 - 1.030   pH 5.0 5.0 - 8.0   Glucose, UA >=500  (A) NEGATIVE mg/dL   Hgb urine dipstick NEGATIVE NEGATIVE   Bilirubin Urine NEGATIVE NEGATIVE   Ketones, ur 80 (A) NEGATIVE mg/dL   Protein, ur 30 (A) NEGATIVE mg/dL   Nitrite NEGATIVE NEGATIVE   Leukocytes,Ua NEGATIVE NEGATIVE   RBC / HPF 0-5 0 - 5 RBC/hpf   WBC, UA 0-5 0 - 5 WBC/hpf   Bacteria, UA NONE SEEN NONE SEEN   Mucus PRESENT    Hyaline Casts, UA PRESENT     Comment: Performed at [facility], [place] [address], [place], [place] [address]  CBG monitoring, ED     Status: Abnormal   Collection Time: 09/04/19 11:53 AM  Result Value Ref Range   Glucose-Capillary 404 (H) 70 - 99 mg/dL    Comment: Glucose reference range applies only to samples taken after fasting for at least 8 hours.  Respiratory Panel by RT PCR (Flu A&B, Covid) - Nasopharyngeal Swab     Status: None   Collection Time: 09/04/19 11:54 AM   Specimen: Nasopharyngeal Swab  Result Value Ref Range   SARS Coronavirus 2 by RT PCR NEGATIVE NEGATIVE    Comment: (NOTE) SARS-CoV-2 target nucleic acids are NOT DETECTED. The SARS-CoV-2 RNA is generally detectable in upper respiratoy specimens during the acute phase of infection. The lowest concentration of SARS-CoV-2 viral copies this assay can detect is 131 copies/mL. A negative result does not preclude SARS-Cov-2 infection and should not be used as the sole basis for treatment or other patient management decisions. A negative result may occur with  improper specimen collection/handling, submission of specimen other than nasopharyngeal swab, presence of viral mutation(s) within the areas targeted by this assay, and inadequate number of viral copies (<131 copies/mL). A negative result must be combined with clinical observations, patient history, and epidemiological information. The expected result is Negative. Fact Sheet for Patients:  PinkCheek.be Fact Sheet for Healthcare Providers:   GravelBags.it This test is not yet ap proved or cleared by the [place] FDA and  has been authorized for detection and/or diagnosis of SARS-CoV-2 by FDA under an Emergency Use Authorization (EUA). This EUA will remain  in effect (meaning this test can be used) for the duration of the COVID-19  declaration under Section 564(b)(1) of the Act, 21 U.S.C. section 360bbb-3(b)(1), unless the authorization is terminated or revoked sooner.    Influenza A by PCR NEGATIVE NEGATIVE   Influenza B by PCR NEGATIVE NEGATIVE    Comment: (NOTE) The Xpert Xpress SARS-CoV-2/FLU/RSV assay is intended as an aid in  the diagnosis of influenza from Nasopharyngeal swab specimens and  should not be used as a sole basis for treatment. Nasal washings and  aspirates are unacceptable for Xpert Xpress SARS-CoV-2/FLU/RSV  testing. Fact Sheet for Patients: PinkCheek.be Fact Sheet for Healthcare Providers: GravelBags.it This test is not yet approved or cleared by the [place] FDA and  has been authorized for detection and/or diagnosis of SARS-CoV-2 by  FDA under an Emergency Use Authorization (EUA). This EUA will remain  in effect (meaning this test can be used) for the duration of the  Covid-19 declaration under Section 564(b)(1) of the Act, 21  U.S.C. section 360bbb-3(b)(1), unless the authorization is  terminated or revoked. Performed at [facility], [place] [address] [place], [place] [address]   CBG monitoring, ED     Status: Abnormal   Collection Time: 09/04/19 12:20 PM  Result Value Ref Range   Glucose-Capillary 335 (H) 70 - 99 mg/dL    Comment: Glucose reference range applies only to samples taken after fasting for at least 8 hours.  Blood gas, venous     Status: Abnormal   Collection Time: 09/04/19 12:50 PM  Result Value Ref Range   pH, Ven 7.283 7.250 - 7.430   pCO2, Ven 40.8 (L) 44.0 -  60.0 mmHg   pO2, Ven 41.0 32.0 - 45.0 mmHg   Bicarbonate 18.7 (L) 20.0 - 28.0 mmol/L   Acid-base deficit 7.3 (H) 0.0 - 2.0 mmol/L   O2 Saturation 71.6 %   Patient temperature 98.6     Comment: Performed at [facility], [place] [address] [place], [place] [address]  CBC     Status: Abnormal   Collection Time: 09/04/19 12:50 PM  Result Value Ref Range   WBC 4.1 4.0 - 10.5 K/uL   RBC 6.34 (H) 4.22 - 5.81 MIL/uL   Hemoglobin 16.9 13.0 - 17.0 g/dL   HCT 50.6 39.0 - 52.0 %   MCV 79.8 (L) 80.0 - 100.0 fL   MCH 26.7 26.0 - 34.0 pg   MCHC 33.4 30.0 - 36.0 g/dL   RDW 14.7 11.5 - 15.5 %   Platelets 221 150 - 400 K/uL   nRBC 0.0 0.0 - 0.2 %    Comment: Performed at [facility], [place] [address] [place], [place] [id number]  Basic metabolic panel     Status: Abnormal   Collection Time: 09/04/19 12:50 PM  Result Value Ref Range   Sodium 142 135 - 145 mmol/L   Potassium 3.8 3.5 - 5.1 mmol/L   Chloride 107 98 - 111 mmol/L   CO2 18 (L) 22 - 32 mmol/L   Glucose, Bld 272 (H) 70 - 99 mg/dL    Comment: Glucose reference range applies only to samples taken after fasting for at least 8 hours.   BUN 31 (H) 6 - 20 mg/dL   Creatinine, Ser 1.54 (H) 0.61 - 1.24 mg/dL   Calcium 10.0 8.9 - 10.3 mg/dL   GFR calc non Af Amer 53 (L) >60 mL/min   GFR calc Af Amer >60 >60 mL/min   Anion gap 17 (H) 5 - 15    Comment: Performed at [facility], [place] [address] [place], [place] [address]  Beta-hydroxybutyric acid     Status: Abnormal   Collection Time: 09/04/19 12:50 PM  Result Value Ref Range   Beta-Hydroxybutyric Acid 5.86 (H) 0.05 - 0.27 mmol/L    Comment: RESULTS CONFIRMED BY MANUAL DILUTION Performed at [facility] [address], [place], [place] [address]   CBG monitoring, ED     Status: Abnormal   Collection Time: 09/04/19  1:26 PM  Result Value Ref Range   Glucose-Capillary 214 (H) 70 - 99 mg/dL    Comment: Glucose reference  range applies only to samples taken after fasting for at least 8 hours.  CBG monitoring, ED     Status: Abnormal   Collection Time: 09/04/19  2:46 PM  Result Value Ref Range   Glucose-Capillary 201 (H) 70 - 99 mg/dL    Comment: Glucose reference range applies only to samples taken after fasting for at least 8 hours.  Basic metabolic panel     Status: Abnormal   Collection Time: 09/04/19  4:10 PM  Result Value Ref Range   Sodium 145 135 - 145 mmol/L   Potassium 3.5 3.5 - 5.1 mmol/L   Chloride 110 98 - 111 mmol/L   CO2 23 22 - 32 mmol/L   Glucose, Bld 132 (H) 70 - 99 mg/dL    Comment: Glucose reference range applies only to samples taken after fasting for at least 8 hours.   BUN 30 (H) 6 - 20 mg/dL   Creatinine, Ser 1.37 (H) 0.61 - 1.24 mg/dL   Calcium 9.8 8.9 - 10.3 mg/dL   GFR calc non Af Amer >60 >60 mL/min   GFR calc Af Amer >60 >60 mL/min   Anion gap 12 5 - 15    Comment: Performed at [facility], [place] [address], [place], [place] [address]  CBG monitoring, ED     Status: Abnormal   Collection Time: 09/04/19  4:52 PM  Result Value Ref Range   Glucose-Capillary 136 (H) 70 - 99 mg/dL    Comment: Glucose reference range applies only to samples taken after fasting for at least 8 hours.  CBG monitoring, ED     Status: Abnormal   Collection Time: 09/04/19  6:03 PM  Result Value Ref Range   Glucose-Capillary 116 (H) 70 - 99 mg/dL    Comment: Glucose reference range applies only to samples taken after fasting for at least 8 hours.  CBG monitoring, ED     Status: Abnormal   Collection Time: 09/04/19  7:20 PM  Result Value Ref Range   Glucose-Capillary 146 (H) 70 - 99 mg/dL    Comment: Glucose reference range applies only to samples taken after fasting for at least 8 hours.  CBG monitoring, ED     Status: Abnormal   Collection Time: 09/04/19  8:15 PM  Result Value Ref Range   Glucose-Capillary 185 (H) 70 - 99 mg/dL    Comment: Glucose reference range applies only  to samples taken after fasting for at least 8 hours.  Beta-hydroxybutyric acid     Status: Abnormal   Collection Time: 09/04/19  8:21 PM  Result Value Ref Range   Beta-Hydroxybutyric Acid 2.68 (H) 0.05 - 0.27 mmol/L    Comment: Performed at [facility], [place] [address] [place], [place] [id number]  Basic metabolic panel     Status: Abnormal   Collection Time: 09/04/19  8:21 PM  Result Value Ref Range   Sodium  141 135 - 145 mmol/L   Potassium 3.4 (L) 3.5 - 5.1 mmol/L   Chloride 107 98 - 111 mmol/L   CO2 22 22 - 32 mmol/L   Glucose, Bld 179 (H) 70 - 99 mg/dL    Comment: Glucose reference range applies only to samples taken after fasting for at least 8 hours.   BUN 28 (H) 6 - 20 mg/dL   Creatinine, Ser 1.31 (H) 0.61 - 1.24 mg/dL   Calcium 9.4 8.9 - 10.3 mg/dL   GFR calc non Af Amer >60 >60 mL/min   GFR calc Af Amer >60 >60 mL/min   Anion gap 12 5 - 15    Comment: Performed at [facility], [place] [address] [place], [place] [address]  CBG monitoring, ED     Status: Abnormal   Collection Time: 09/04/19  9:20 PM  Result Value Ref Range   Glucose-Capillary 169 (H) 70 - 99 mg/dL    Comment: Glucose reference range applies only to samples taken after fasting for at least 8 hours.   No results found.  Pending Labs Unresulted Labs (From admission, onward)    Start     Ordered   09/11/19 0500  Creatinine, serum  (enoxaparin (LOVENOX)    CrCl >/= 30 ml/min)  Weekly,   R    Comments: while on enoxaparin therapy    09/04/19 1212   09/05/19 0500  Comprehensive metabolic panel  Tomorrow morning,   R     09/04/19 1212   09/05/19 0500  CBC  Tomorrow morning,   R     09/04/19 1212   09/04/19 [id number]  Basic metabolic panel  (Diabetes Ketoacidosis (DKA))  STAT Now then every 4 hours ,   STAT     09/04/19 1212   09/04/19 1210  Beta-hydroxybutyric acid  (Diabetes Ketoacidosis (DKA))  Now then every 8 hours,   R (with STAT occurrences)     09/04/19 1212           Vitals/Pain Today's Vitals   09/04/19 2100 09/04/19 2115 09/04/19 2130 09/04/19 2227  BP: 139/90 (!) 131/93 (!) 130/94 (!) 137/92  Pulse: (!) 102 (!) 103 (!) 109 (!) 107  Resp: (!) 21 18 17 13   Temp:    98.3 F (36.8 C)  TempSrc:    Oral  SpO2: 99% 97% 98% 97%  Weight:      Height:      PainSc:    0-No pain    Isolation Precautions No active isolations  Medications Medications  enoxaparin (LOVENOX) injection 40 mg (has no administration in time range)  acetaminophen (TYLENOL) tablet 650 mg (has no administration in time range)    Or  acetaminophen (TYLENOL) suppository 650 mg (has no administration in time range)  ondansetron (ZOFRAN) tablet 4 mg (has no administration in time range)    Or  ondansetron (ZOFRAN) injection 4 mg (has no administration in time range)  dextrose 50 % solution 0-50 mL (has no administration in time range)  insulin aspart (novoLOG) injection 0-15 Units (has no administration in time range)  sodium chloride 0.9 % bolus 1,000 mL (0 mLs Intravenous Stopped 09/04/19 1000)    Mobility walks

## 2019-09-05 ENCOUNTER — Other Ambulatory Visit: Payer: Self-pay

## 2019-09-05 ENCOUNTER — Encounter (HOSPITAL_COMMUNITY): Payer: Self-pay | Admitting: Emergency Medicine

## 2019-09-05 DIAGNOSIS — E0810 Diabetes mellitus due to underlying condition with ketoacidosis without coma: Secondary | ICD-10-CM

## 2019-09-05 DIAGNOSIS — E1165 Type 2 diabetes mellitus with hyperglycemia: Secondary | ICD-10-CM | POA: Diagnosis not present

## 2019-09-05 DIAGNOSIS — E1110 Type 2 diabetes mellitus with ketoacidosis without coma: Secondary | ICD-10-CM | POA: Diagnosis not present

## 2019-09-05 LAB — CBC
HCT: 44.3 % (ref 39.0–52.0)
Hemoglobin: 14.7 g/dL (ref 13.0–17.0)
MCH: 26 pg (ref 26.0–34.0)
MCHC: 33.2 g/dL (ref 30.0–36.0)
MCV: 78.4 fL — ABNORMAL LOW (ref 80.0–100.0)
Platelets: 191 10*3/uL (ref 150–400)
RBC: 5.65 MIL/uL (ref 4.22–5.81)
RDW: 14.2 % (ref 11.5–15.5)
WBC: 4.4 10*3/uL (ref 4.0–10.5)
nRBC: 0 % (ref 0.0–0.2)

## 2019-09-05 LAB — COMPREHENSIVE METABOLIC PANEL
ALT: 18 U/L (ref 0–44)
AST: 19 U/L (ref 15–41)
Albumin: 3.5 g/dL (ref 3.5–5.0)
Alkaline Phosphatase: 53 U/L (ref 38–126)
Anion gap: 10 (ref 5–15)
BUN: 26 mg/dL — ABNORMAL HIGH (ref 6–20)
CO2: 21 mmol/L — ABNORMAL LOW (ref 22–32)
Calcium: 9.3 mg/dL (ref 8.9–10.3)
Chloride: 105 mmol/L (ref 98–111)
Creatinine, Ser: 1.1 mg/dL (ref 0.61–1.24)
GFR calc Af Amer: >60 mL/min (ref >60)
GFR calc non Af Amer: >60 mL/min (ref >60)
Glucose, Bld: 303 mg/dL — ABNORMAL HIGH (ref 70–99)
Potassium: 3.5 mmol/L (ref 3.5–5.1)
Sodium: 136 mmol/L (ref 135–145)
Total Bilirubin: 1.3 mg/dL — ABNORMAL HIGH (ref 0.3–1.2)
Total Protein: 6.8 g/dL (ref 6.5–8.1)

## 2019-09-05 LAB — HEMOGLOBIN A1C
Hgb A1c MFr Bld: 14.4 % — ABNORMAL HIGH (ref 4.8–5.6)
Mean Plasma Glucose: 366.58 mg/dL

## 2019-09-05 LAB — BETA-HYDROXYBUTYRIC ACID: Beta-Hydroxybutyric Acid: 1.78 mmol/L — ABNORMAL HIGH (ref 0.05–0.27)

## 2019-09-05 LAB — BASIC METABOLIC PANEL
Anion gap: 13 (ref 5–15)
BUN: 27 mg/dL — ABNORMAL HIGH (ref 6–20)
CO2: 20 mmol/L — ABNORMAL LOW (ref 22–32)
Calcium: 9.5 mg/dL (ref 8.9–10.3)
Chloride: 109 mmol/L (ref 98–111)
Creatinine, Ser: 1.16 mg/dL (ref 0.61–1.24)
GFR calc Af Amer: >60 mL/min (ref >60)
GFR calc non Af Amer: >60 mL/min (ref >60)
Glucose, Bld: 147 mg/dL — ABNORMAL HIGH (ref 70–99)
Potassium: 3.3 mmol/L — ABNORMAL LOW (ref 3.5–5.1)
Sodium: 142 mmol/L (ref 135–145)

## 2019-09-05 LAB — GLUCOSE, CAPILLARY
Glucose-Capillary: 139 mg/dL — ABNORMAL HIGH (ref 70–99)
Glucose-Capillary: 290 mg/dL — ABNORMAL HIGH (ref 70–99)
Glucose-Capillary: 266 mg/dL — ABNORMAL HIGH (ref 70–99)
Glucose-Capillary: 256 mg/dL — ABNORMAL HIGH (ref 70–99)
Glucose-Capillary: 145 mg/dL — ABNORMAL HIGH (ref 70–99)

## 2019-09-05 MED ORDER — INSULIN ASPART 100 UNIT/ML ~~LOC~~ SOLN
5.0000 [IU] | Freq: Three times a day (TID) | SUBCUTANEOUS | Status: DC
  Administered 2019-09-05 – 2019-09-06 (×3): 5 [IU] via SUBCUTANEOUS

## 2019-09-05 MED ORDER — INSULIN GLARGINE 100 UNIT/ML ~~LOC~~ SOLN
15.0000 [IU] | Freq: Every day | SUBCUTANEOUS | Status: DC
  Administered 2019-09-05 – 2019-09-06 (×2): 15 [IU] via SUBCUTANEOUS
  Filled 2019-09-05 (×2): qty 0.15

## 2019-09-05 MED ORDER — INSULIN ASPART 100 UNIT/ML ~~LOC~~ SOLN: 0.0000 [IU] | Freq: Every day | SUBCUTANEOUS | Status: DC

## 2019-09-05 MED ORDER — INSULIN ASPART 100 UNIT/ML ~~LOC~~ SOLN
0.0000 [IU] | Freq: Three times a day (TID) | SUBCUTANEOUS | Status: DC
  Administered 2019-09-05 (×2): 5 [IU] via SUBCUTANEOUS
  Administered 2019-09-06: 9 [IU] via SUBCUTANEOUS
  Administered 2019-09-06: 08:00:00 5 [IU] via SUBCUTANEOUS

## 2019-09-05 NOTE — Progress Notes (Signed)
Inpatient Diabetes Program Recommendations  AACE/ADA: New Consensus Statement on Inpatient Glycemic Control (2015)  Target Ranges:  Prepandial:   less than 140 mg/dL      Peak postprandial:   less than 180 mg/dL (1-2 hours)      Critically ill patients:  140 - 180 mg/dL   Results for JAMARKUS, HYMER (MRN [id number]) as of 09/05/2019 10:33  Ref. Range 09/04/2019 13:26 09/04/2019 14:46 09/04/2019 16:52 09/04/2019 18:03 09/04/2019 19:20 09/04/2019 20:15 09/04/2019 21:20  Glucose-Capillary Latest Ref Range: 70 - 99 mg/dL 214 (H)  IV Insulin Drip 201 (H)  IV Insulin Drip 136 (H)  IV Insulin Drip 116 (H)  IV Insulin Drip 146 (H)  IV Insulin Drip 185 (H)  IV Insulin Drip 169 (H)  IV Insulin Drip Stopped at 10:09pm   Results for MATEUSZ, ROLLS (MRN [id number]) as of 09/05/2019 10:33  Ref. Range 09/05/2019 03:48 09/05/2019 07:31  Glucose-Capillary Latest Ref Range: 70 - 99 mg/dL 290 (H)  8 units NOVOLOG  266 (H)  8 units NOVOLOG +  15 units LANTUS given at 8am   Results for SKEET, ROSENBOOM (MRN [id number]) as of 09/05/2019 10:33  Ref. Range 03/30/2019 10:05 07/06/2019 09:24 09/05/2019 06:09  Hemoglobin A1C Latest Ref Range: 4.8 - 5.6 % 16.5 (H) 12.6 (H) 14.4 (H)  (366 mg/dl)    Home DM Meds: Lantus 15 units Daily                             Metformin 1000 mg BID  Current Orders: Lantus 15 units Daily      Novolog Moderate Correction Scale/ SSI (0-15 units) Q4 hours     MD- Note IV Insulin Drip stopped last PM--NO Lantus given prior to d/c of the IV Insulin Drip  Lantus just started this AM  Will follow   PCP: Dr. Abelino Derrick with [facility]--last seen 07/06/2019--A1c at that visit was 12.6%--Told PCP at that visit that he hadn't taken insulin in a few weeks prior to that visit b/c he stated he ran out of insulin pen needles--telephone conversation between PCP office and pt on 07/09/2019 showed that pt stated insurance was not covering Lantus insulin--Unsure what outcome  was    --Will follow patient during hospitalization--  Wyn Quaker RN, MSN, CDE Diabetes Coordinator Inpatient Glycemic Control Team Team Pager: [phone] (8a-5p)

## 2019-09-05 NOTE — Progress Notes (Signed)
The pt's Hr was elevated on admission. An order was given to place the pt on a continuous  pulse ox and telemetry. I will continue to monitor.

## 2019-09-05 NOTE — Progress Notes (Addendum)
[facility]                                                                              Patient Demographics  Mark Fry, is a 49 y.o. male, DOB - [birth year], [id number]  Admit date - 09/04/2019   Admitting Physician Mark Moras, PA-C  Outpatient Primary MD for the patient is Mark Maw, MD  Outpatient specialists:   LOS - 1  days   Medical records reviewed and are as summarized below:    Chief Complaint  Patient presents with  . Hyperglycemia       Brief summary   Patient is a 49 year old male with history of diabetes mellitus type 2, diagnosed 8 years ago, on Lantus and Metformin presented with generalized fatigue, progressively worsening over the last few days.  Patient was having high blood sugars in 400s during the day and 300s during the night.  Patient reports that he has only been taking Metformin as he ran out of insulin pens before the insulin (states his insurance did not cover it, hence he has been off insulin for > 2 months) also reported nausea and vomiting 3 days ago, feeling very weak  In ED, patient was found to be hyperglycemic CBG 430 with anion gap of 20, creatinine 1.5, hemoglobin 17.2, acidotic, pH 7.2, PCO2 40.8, UA with ketones and glycosuria  Assessment & Plan    Principal Problem:   DKA (diabetic ketoacidoses) ([place]) with uncontrolled diabetes mellitus, type II, IDDM -Patient reports that his insurance did not cover the insulin pen hence he has been off Lantus for last 2 months.  Initially his insurance did cover the first pen.  Unfortunate situation, patient has not tried any vials either.  Has been drinking 0-calorie sodas. -Patient was placed on IV insulin drip discontinued once her anion gap was 12 but Lantus apparently was not given in ED. 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  -Case management consult regarding insulin pens [facility] [employer] should cover it, not sure what is the reason he was denied) -Placed on Lantus 15 units daily, added meal coverage 5 units 3 times daily AC, sliding scale insulin sensitive, before every meal and at bedtime -Dietitian and diabetic educator consult  Acute kidney injury -At least secondary to profound dehydration and DKA -Creatinine 1.59 at the time of admission -Patient was placed on aggressive IV fluid hydration, currently improving 1.1   Code Status: Full CODE STATUS DVT Prophylaxis:  Lovenox  Family Communication: Discussed all imaging results, lab results, explained to the patient   Disposition Plan:     Status is: Inpatient  Remains inpatient appropriate because:Inpatient level of care appropriate due to severity of illness   Dispo: The patient is from: Home              Anticipated d/c is to: Home  Anticipated d/c date is: 2 days              Patient currently is not medically stable to d/c.       Time Spent in minutes 35 minutes  Procedures:  None  Consultants:   None  Antimicrobials:   Anti-infectives (From admission, onward)   None          Medications  Scheduled Meds: . enoxaparin (LOVENOX) injection  40 mg Subcutaneous Q24H  . insulin aspart  0-5 Units Subcutaneous QHS  . insulin aspart  0-9 Units Subcutaneous TID WC  . insulin aspart  5 Units Subcutaneous TID WC  . insulin glargine  15 Units Subcutaneous Daily   Continuous Infusions: PRN Meds:.acetaminophen **OR** acetaminophen, dextrose, ondansetron **OR** ondansetron (ZOFRAN) IV      Subjective:   Mark Fry was seen and examined today.  Overnight, drip off but Lantus not started.  This morning a.m. blood sugar 366.  Patient denies dizziness, chest pain, shortness of breath, abdominal pain, N/V/D/C, new weakness,  numbess, tingling.     Objective:   Vitals:   09/05/19 0100 09/05/19 0128 09/05/19 0350 09/05/19 0729  BP:  126/83 116/78 112/87  Pulse:  98 (!) 101 (!) 106  Resp: 15 18 16 15   Temp:  (!) 97.5 F (36.4 C) 98.5 F (36.9 C) 98.6 F (37 C)  TempSrc:  Oral Oral Oral  SpO2:  100% 97% 96%  Weight:      Height:        Intake/Output Summary (Last 24 hours) at 09/05/2019 1134 Last data filed at 09/04/2019 2209 Gross per 24 hour  Intake 536.96 ml  Output --  Net 536.96 ml     Wt Readings from Last 3 Encounters:  09/04/19 86.2 kg  07/06/19 86.2 kg  05/04/19 87.2 kg     Exam  General: Alert and oriented x 3, NAD  Cardiovascular: S1 S2 auscultated, no murmurs, RRR  Respiratory: Clear to auscultation bilaterally, no wheezing, rales or rhonchi  Gastrointestinal: Soft, nontender, nondistended, + bowel sounds  Ext: no pedal edema bilaterally  Neuro: No new deficits  Musculoskeletal: No digital cyanosis, clubbing  Skin: No rashes  Psych: Normal affect and demeanor, alert and oriented x3    Data Reviewed:  I have personally reviewed following labs and imaging studies  Micro Results Recent Results (from the past 240 hour(s))  Respiratory Panel by RT PCR (Flu A&B, Covid) - Nasopharyngeal Swab     Status: None   Collection Time: 09/04/19 11:54 AM   Specimen: Nasopharyngeal Swab  Result Value Ref Range Status   SARS Coronavirus 2 by RT PCR NEGATIVE NEGATIVE Final    Comment: (NOTE) SARS-CoV-2 target nucleic acids are NOT DETECTED. The SARS-CoV-2 RNA is generally detectable in upper respiratoy specimens during the acute phase of infection. The lowest concentration of SARS-CoV-2 viral copies this assay can detect is 131 copies/mL. A negative result does not preclude SARS-Cov-2 infection and should not be used as the sole basis for treatment or other patient management decisions. A negative result may occur with  improper specimen collection/handling, submission of  specimen other than nasopharyngeal swab, presence of viral mutation(s) within the areas targeted by this assay, and inadequate number of viral copies (<131 copies/mL). A negative result must be combined with clinical observations, patient history, and epidemiological information. The expected result is Negative. Fact Sheet for Patients:  PinkCheek.be Fact Sheet for Healthcare Providers:  GravelBags.it This test is not yet ap proved or cleared by  the [employer] and  has been authorized for detection and/or diagnosis of SARS-CoV-2 by FDA under an Emergency Use Authorization (EUA). This EUA will remain  in effect (meaning this test can be used) for the duration of the COVID-19 declaration under Section 564(b)(1) of the Act, 21 U.S.C. section 360bbb-3(b)(1), unless the authorization is terminated or revoked sooner.    Influenza A by PCR NEGATIVE NEGATIVE Final   Influenza B by PCR NEGATIVE NEGATIVE Final    Comment: (NOTE) The Xpert Xpress SARS-CoV-2/FLU/RSV assay is intended as an aid in  the diagnosis of influenza from Nasopharyngeal swab specimens and  should not be used as a sole basis for treatment. Nasal washings and  aspirates are unacceptable for Xpert Xpress SARS-CoV-2/FLU/RSV  testing. Fact Sheet for Patients: PinkCheek.be Fact Sheet for Healthcare Providers: GravelBags.it This test is not yet approved or cleared by the [place] FDA and  has been authorized for detection and/or diagnosis of SARS-CoV-2 by  FDA under an Emergency Use Authorization (EUA). This EUA will remain  in effect (meaning this test can be used) for the duration of the  Covid-19 declaration under Section 564(b)(1) of the Act, 21  U.S.C. section 360bbb-3(b)(1), unless the authorization is  terminated or revoked. Performed at [facility], [place] [address] [place], [place] [address]     Radiology Reports No results found.  Lab Data:  CBC: Recent Labs  Lab 09/04/19 1010 09/04/19 1250 09/05/19 0609  WBC 4.5 4.1 4.4  NEUTROABS 3.6  --   --   HGB 17.2* 16.9 14.7  HCT 53.4* 50.6 44.3  MCV 81.0 79.8* 78.4*  PLT 212 221 [id number]   Basic Metabolic Panel: Recent Labs  Lab 09/04/19 1250 09/04/19 1610 09/04/19 2021 09/04/19 2348 09/05/19 0609  NA 142 145 141 142 136  K 3.8 3.5 3.4* 3.3* 3.5  CL 107 110 107 109 105  CO2 18* 23 22 20* 21*  GLUCOSE 272* 132* 179* 147* 303*  BUN 31* 30* 28* 27* 26*  CREATININE 1.54* 1.37* 1.31* 1.16 1.10  CALCIUM 10.0 9.8 9.4 9.5 9.3   GFR: Estimated Creatinine Clearance: 92.1 mL/min (by C-G formula based on SCr of 1.1 mg/dL). Liver Function Tests: Recent Labs  Lab 09/05/19 0609  AST 19  ALT 18  ALKPHOS 53  BILITOT 1.3*  PROT 6.8  ALBUMIN 3.5   No results for input(s): LIPASE, AMYLASE in the last 168 hours. No results for input(s): AMMONIA in the last 168 hours. Coagulation Profile: No results for input(s): INR, PROTIME in the last 168 hours. Cardiac Enzymes: No results for input(s): CKTOTAL, CKMB, CKMBINDEX, TROPONINI in the last 168 hours. BNP (last 3 results) No results for input(s): PROBNP in the last 8760 hours. HbA1C: Recent Labs    09/05/19 0609  HGBA1C 14.4*   CBG: Recent Labs  Lab 09/04/19 1920 09/04/19 2015 09/04/19 2120 09/05/19 0348 09/05/19 0731  GLUCAP 146* 185* 169* 290* 266*   Lipid Profile: No results for input(s): CHOL, HDL, LDLCALC, TRIG, CHOLHDL, LDLDIRECT in the last 72 hours. Thyroid Function Tests: No results for input(s): TSH, T4TOTAL, FREET4, T3FREE, THYROIDAB in the last 72 hours. Anemia Panel: No results for input(s): VITAMINB12, FOLATE, FERRITIN, TIBC, IRON, RETICCTPCT in the last 72 hours. Urine analysis:    Component Value Date/Time   COLORURINE YELLOW 09/04/2019 1010   APPEARANCEUR CLEAR 09/04/2019 1010   LABSPEC 1.030 09/04/2019 1010    PHURINE 5.0 09/04/2019 1010   GLUCOSEU >=500 (A) 09/04/2019 1010  GLUCOSEU 500 (A) 05/04/2019 1007   HGBUR NEGATIVE 09/04/2019 1010   BILIRUBINUR NEGATIVE 09/04/2019 1010   KETONESUR 80 (A) 09/04/2019 1010   PROTEINUR 30 (A) 09/04/2019 1010   UROBILINOGEN 0.2 05/04/2019 1007   NITRITE NEGATIVE 09/04/2019 1010   LEUKOCYTESUR NEGATIVE 09/04/2019 1010     Roya Gieselman M.D. [facility] 09/05/2019, 11:34 AM   Call night coverage person covering after 7pm

## 2019-09-06 DIAGNOSIS — E1165 Type 2 diabetes mellitus with hyperglycemia: Secondary | ICD-10-CM | POA: Diagnosis not present

## 2019-09-06 DIAGNOSIS — E1110 Type 2 diabetes mellitus with ketoacidosis without coma: Secondary | ICD-10-CM | POA: Diagnosis not present

## 2019-09-06 DIAGNOSIS — E0810 Diabetes mellitus due to underlying condition with ketoacidosis without coma: Secondary | ICD-10-CM | POA: Diagnosis not present

## 2019-09-06 LAB — BASIC METABOLIC PANEL
Anion gap: 10 (ref 5–15)
BUN: 25 mg/dL — ABNORMAL HIGH (ref 6–20)
CO2: 26 mmol/L (ref 22–32)
Calcium: 9.2 mg/dL (ref 8.9–10.3)
Chloride: 101 mmol/L (ref 98–111)
Creatinine, Ser: 1.08 mg/dL (ref 0.61–1.24)
GFR calc Af Amer: >60 mL/min (ref >60)
GFR calc non Af Amer: >60 mL/min (ref >60)
Glucose, Bld: 277 mg/dL — ABNORMAL HIGH (ref 70–99)
Potassium: 3 mmol/L — ABNORMAL LOW (ref 3.5–5.1)
Sodium: 137 mmol/L (ref 135–145)

## 2019-09-06 LAB — GLUCOSE, CAPILLARY
Glucose-Capillary: 300 mg/dL — ABNORMAL HIGH (ref 70–99)
Glucose-Capillary: 365 mg/dL — ABNORMAL HIGH (ref 70–99)

## 2019-09-06 MED ORDER — "PEN NEEDLES 3/16"" 31G X 5 MM MISC": 3 refills | Status: DC

## 2019-09-06 MED ORDER — INSULIN DETEMIR 100 UNIT/ML FLEXPEN
21.0000 [IU] | PEN_INJECTOR | Freq: Every day | SUBCUTANEOUS | 3 refills | Status: DC
Start: 2019-09-07 — End: 2021-05-20

## 2019-09-06 MED ORDER — INSULIN GLARGINE 100 UNIT/ML ~~LOC~~ SOLN: 5.0000 [IU] | Freq: Once | SUBCUTANEOUS | Status: DC

## 2019-09-06 MED ORDER — INSULIN ASPART 100 UNIT/ML ~~LOC~~ SOLN
7.0000 [IU] | Freq: Three times a day (TID) | SUBCUTANEOUS | Status: DC
  Administered 2019-09-06: 7 [IU] via SUBCUTANEOUS

## 2019-09-06 MED ORDER — INSULIN GLARGINE 100 UNIT/ML ~~LOC~~ SOLN
6.0000 [IU] | Freq: Once | SUBCUTANEOUS | Status: AC
  Administered 2019-09-06: 6 [IU] via SUBCUTANEOUS
  Filled 2019-09-06: qty 0.06

## 2019-09-06 MED ORDER — LIVING WELL WITH DIABETES BOOK
Freq: Once | Status: DC
  Filled 2019-09-06: qty 1

## 2019-09-06 MED ORDER — INSULIN ASPART 100 UNIT/ML FLEXPEN
7.0000 [IU] | PEN_INJECTOR | Freq: Three times a day (TID) | SUBCUTANEOUS | 3 refills | Status: DC
Start: 2019-09-06 — End: 2021-05-20

## 2019-09-06 NOTE — Plan of Care (Signed)

## 2019-09-06 NOTE — Plan of Care (Signed)
  Problem: Education: Goal: Knowledge of General Education information will improve Description: Including pain rating scale, medication(s)/side effects and non-pharmacologic comfort measures 09/06/2019 1542 by Hubert Azure, RN Outcome: Adequate for Discharge 09/06/2019 1355 by Hubert Azure, RN Outcome: Progressing   Problem: Health Behavior/Discharge Planning: Goal: Ability to manage health-related needs will improve 09/06/2019 1542 by Hubert Azure, RN Outcome: Adequate for Discharge 09/06/2019 1355 by Hubert Azure, RN Outcome: Progressing   Problem: Clinical Measurements: Goal: Ability to maintain clinical measurements within normal limits will improve 09/06/2019 1542 by Hubert Azure, RN Outcome: Adequate for Discharge 09/06/2019 1355 by Hubert Azure, RN Outcome: Progressing Goal: Will remain free from infection 09/06/2019 1542 by Hubert Azure, RN Outcome: Adequate for Discharge 09/06/2019 1355 by Hubert Azure, RN Outcome: Progressing Goal: Diagnostic test results will improve Outcome: Adequate for Discharge Goal: Respiratory complications will improve Outcome: Adequate for Discharge Goal: Cardiovascular complication will be avoided Outcome: Adequate for Discharge   Problem: Activity: Goal: Risk for activity intolerance will decrease Outcome: Adequate for Discharge   Problem: Nutrition: Goal: Adequate nutrition will be maintained Outcome: Adequate for Discharge   Problem: Coping: Goal: Level of anxiety will decrease Outcome: Adequate for Discharge   Problem: Elimination: Goal: Will not experience complications related to bowel motility Outcome: Adequate for Discharge Goal: Will not experience complications related to urinary retention Outcome: Adequate for Discharge   Problem: Pain Managment: Goal: General experience of comfort will improve Outcome: Adequate for Discharge   Problem: Safety: Goal: Ability to remain free from injury will  improve Outcome: Adequate for Discharge   Problem: Skin Integrity: Goal: Risk for impaired skin integrity will decrease Outcome: Adequate for Discharge

## 2019-09-06 NOTE — TOC Benefit Eligibility Note (Signed)
Transition of Care [facility]) Benefit Eligibility Note    Patient Details  Name: Kanav Kazmierczak MRN: [id number] Date of Birth: 10/09/1970   Medication/Dose: Nancee Liter, Levemir and Novolog 73 30 mix are covered Lantus not on formulary  Covered?: Yes  Tier: 2 Drug  Prescription Coverage Preferred Pharmacy: local pharmacy  Spoke with Person/Company/Phone Number:: Jennifer/ [facility] [employer] [phone]  Co-Pay: $35.00  Prior Approval: No  Deductible: Met       Kerin Salen Phone Number: 09/06/2019, 1:12 PM

## 2019-09-06 NOTE — Progress Notes (Signed)
Inpatient Diabetes Program Recommendations  AACE/ADA: New Consensus Statement on Inpatient Glycemic Control (2015)  Target Ranges:  Prepandial:   less than 140 mg/dL      Peak postprandial:   less than 180 mg/dL (1-2 hours)      Critically ill patients:  140 - 180 mg/dL   Lab Results  Component Value Date   GLUCAP 365 (H) 09/06/2019   HGBA1C 14.4 (H) 09/05/2019    Review of Glycemic Control  Diabetes history: DM2 Outpatient Diabetes medications: previously on Lantus 15 units QHS, metformin 1000 mg bid  Current orders for Inpatient glycemic control: Novolog 0-9 units tidwc and hs + 7 units tidwc  Inpatient Diabetes Program Recommendations:     Discharge: Levemir 21 units QD Novolog/Humalog 7 units tidwc metformin 1000 mg bid  Ordered Living Well with Diabetes book.  Long discussion with pt regarding importance of achieving optimal glycemic control. Pt had stopped taking insulin and had insurance problems in approval of Lantus. Discussed HgbA1C of 14.4% and goal of 7%. Pt is very active and moves a lot at work. Discussed diet, exercise, stress, monitoring, hypoglycemia s/s and treatment. Pt very motivated to make changes to control his blood sugars. Instructed pt to f/u with PCP within a week of hospitalization. Gave ideas of healthy cereals, beverages, and menu ideas. Pt does not drink alcohol or smoke. Seems to have healthy lifestyle. Discussed injecting both basal and bolus insulin. Demonstrated correct pen administration. Answered all questions.   Discussed above with MD and RN.  Thank you. Lorenda Peck, RD, LDN, CDE Inpatient Diabetes Coordinator [phone]

## 2019-09-06 NOTE — Discharge Summary (Signed)
Physician Discharge Summary   Patient ID: Mark Fry MRN: [id number] DOB/AGE: July 24, 1970 49 y.o.  Admit date: 09/04/2019 Discharge date: 09/06/2019  Primary Care Physician:  Libby Maw, MD   Recommendations for Outpatient Follow-up:  1. Follow up with PCP in 1-2 weeks 2. Please follow hemoglobin A1c in 6 to 8 weeks to assess interval improvement 3. Patient started on Levemir 21 units at bedtime, NovoLog meal coverage 7 units 3 times daily AC  Home Health: None  Equipment/Devices:   Discharge Condition: stable CODE STATUS: FULL Diet recommendation: carb mod   Discharge Diagnoses:    . DKA (diabetic ketoacidoses) ([place]) Uncontrolled diabetes mellitus, type II, IDDM, Acute kidney injury due to profound dehydration  Consults: None    Allergies:  No Known Allergies   DISCHARGE MEDICATIONS: Allergies as of 09/06/2019   No Known Allergies     Medication List    STOP taking these medications   insulin glargine 100 unit/mL Sopn Commonly known as: LANTUS   metFORMIN 1000 MG tablet Commonly known as: GLUCOPHAGE     TAKE these medications   insulin aspart 100 UNIT/ML FlexPen Commonly known as: NOVOLOG Inject 7 Units into the skin 3 (three) times daily with meals.   insulin detemir 100 UNIT/ML FlexPen Commonly known as: LEVEMIR Inject 21 Units into the skin at bedtime. Start taking on: September 07, 2019   Pen Needles 3/16" 31G X 5 MM Misc Take Levemir at bedtime, NovoLog at mealtimes, flex pens as directed        Brief H and P: For complete details please refer to admission H and P, but in brief Patient is a 49 year old male with history of diabetes mellitus type 2, diagnosed 8 years ago, on Lantus and Metformin presented with generalized fatigue, progressively worsening over the last few days.  Patient was having high blood sugars in 400s during the day and 300s during the night.  Patient reports that he has only been taking Metformin as he ran out of  insulin pens before the insulin (states his insurance did not cover it, hence he has been off insulin for > 2 months) also reported nausea and vomiting 3 days ago, feeling very weak  In ED, patient was found to be hyperglycemic CBG 430 with anion gap of 20, creatinine 1.5, hemoglobin 17.2, acidotic, pH 7.2, PCO2 40.8, UA with ketones and glycosuria   Hospital Course:   DKA (diabetic ketoacidoses) ([place]) with uncontrolled diabetes mellitus, type II, IDDM -Patient reports that his insurance did not cover the insulin pen hence he has been off Lantus for last 2 months.  Initially his insurance did cover the first pen.  Unfortunate situation, patient has not tried any vials either.  Has been drinking 0-calorie sodas. Currently CBGs elevated in 300s -Hemoglobin A1c 14.3 -Case management and diabetic coordinator were consulted regarding insulin pens [facility] [employer] should cover it, not sure what is the reason he was denied).  Case management discussed his insurance benefits, Lantus is not covered however Levemir FlexPen's and NovoLog FlexPen's are covered. -Patient was placed on Levemir 21 units at bedtime, NovoLog meal coverage 7 units 3 times daily AC flex pens.    Acute kidney injury -At least secondary to profound dehydration and DKA -Creatinine 1.59 at the time of admission Patient was placed on aggressive IV fluid hydration, creatinine 1.0 at the time of discharge    Day of Discharge S: Feeling a lot better, hoping to go home today  BP 119/79 (BP Location: Left Arm)  Pulse 85   Temp 98.1 F (36.7 C) (Oral)   Resp 18   Ht 6' (1.829 m)   Wt 86.2 kg   SpO2 100%   BMI 25.77 kg/m   Physical Exam: General: Alert and awake oriented x3 not in any acute distress. HEENT: anicteric sclera, pupils reactive to light and accommodation CVS: S1-S2 clear no murmur rubs or gallops Chest: clear to auscultation bilaterally, no wheezing rales or rhonchi Abdomen: soft nontender,  nondistended, normal bowel sounds Extremities: no cyanosis, clubbing or edema noted bilaterally Neuro: Cranial nerves II-XII intact, no focal neurological deficits    Get Medicines reviewed and adjusted: Please take all your medications with you for your next visit with your Primary MD  Please request your Primary MD to go over all hospital tests and procedure/radiological results at the follow up. Please ask your Primary MD to get all Hospital records sent to his/her office.  If you experience worsening of your admission symptoms, develop shortness of breath, life threatening emergency, suicidal or homicidal thoughts you must seek medical attention immediately by calling 911 or calling your MD immediately  if symptoms less severe.  You must read complete instructions/literature along with all the possible adverse reactions/side effects for all the Medicines you take and that have been prescribed to you. Take any new Medicines after you have completely understood and accept all the possible adverse reactions/side effects.   Do not drive when taking pain medications.   Do not take more than prescribed Pain, Sleep and Anxiety Medications  Special Instructions: If you have smoked or chewed Tobacco  in the last 2 yrs please stop smoking, stop any regular Alcohol  and or any Recreational drug use.  Wear Seat belts while driving.  Please note  You were cared for by a hospitalist during your hospital stay. Once you are discharged, your primary care physician will handle any further medical issues. Please note that NO REFILLS for any discharge medications will be authorized once you are discharged, as it is imperative that you return to your primary care physician (or establish a relationship with a primary care physician if you do not have one) for your aftercare needs so that they can reassess your need for medications and monitor your lab values.   The results of significant diagnostics from  this hospitalization (including imaging, microbiology, ancillary and laboratory) are listed below for reference.      Procedures/Studies:   No results found.    LAB RESULTS: Basic Metabolic Panel: Recent Labs  Lab 09/05/19 0609 09/06/19 0544  NA 136 137  K 3.5 3.0*  CL 105 101  CO2 21* 26  GLUCOSE 303* 277*  BUN 26* 25*  CREATININE 1.10 1.08  CALCIUM 9.3 9.2   Liver Function Tests: Recent Labs  Lab 09/05/19 0609  AST 19  ALT 18  ALKPHOS 53  BILITOT 1.3*  PROT 6.8  ALBUMIN 3.5   No results for input(s): LIPASE, AMYLASE in the last 168 hours. No results for input(s): AMMONIA in the last 168 hours. CBC: Recent Labs  Lab 09/04/19 1010 09/04/19 1010 09/04/19 1250 09/04/19 1250 09/05/19 0609  WBC 4.5   < > 4.1  --  4.4  NEUTROABS 3.6  --   --   --   --   HGB 17.2*   < > 16.9  --  14.7  HCT 53.4*   < > 50.6  --  44.3  MCV 81.0   < > 79.8*   < > 78.4*  PLT 212   < > 221  --  191   < > = values in this interval not displayed.   Cardiac Enzymes: No results for input(s): CKTOTAL, CKMB, CKMBINDEX, TROPONINI in the last 168 hours. BNP: Invalid input(s): POCBNP CBG: Recent Labs  Lab 09/06/19 0813 09/06/19 1257  GLUCAP 300* 365*       Disposition and Follow-up: Discharge Instructions    Diet Carb Modified   Complete by: As directed    Discharge instructions   Complete by: As directed    It is VERY IMPORTANT that you follow up with a PCP on a regular basis.  Check your blood glucoses before each meal and at bedtime and maintain a log of your readings.  Bring this log with you when you follow up with your PCP so that he or she can adjust your insulin at your follow up visit. If your a.m. blood sugars are running too low (<100), you can cut down on the Levemir in the increments of 3 units.  Check your blood sugars with meals, if running low, cut down NovoLog to 5 units with meals.   Increase activity slowly   Complete by: As directed         DISPOSITION: Home   DISCHARGE FOLLOW-UP Follow-up Information    Libby Maw, MD. Schedule an appointment as soon as possible for a visit in 2 week(s).   Specialty: Family Medicine Contact information: [place] [address] [phone]            Time coordinating discharge:  35 mins  Signed:   Estill Cotta M.D. [facility] 09/06/2019, 2:27 PM

## 2019-10-05 ENCOUNTER — Ambulatory Visit: Payer: Federal, State, Local not specified - PPO | Admitting: Family Medicine

## 2019-10-19 ENCOUNTER — Encounter: Payer: Federal, State, Local not specified - PPO | Admitting: Family Medicine

## 2019-11-16 ENCOUNTER — Ambulatory Visit: Payer: Federal, State, Local not specified - PPO | Admitting: Family Medicine

## 2019-12-10 ENCOUNTER — Encounter: Payer: Self-pay | Admitting: Family Medicine

## 2019-12-10 ENCOUNTER — Telehealth: Payer: Self-pay | Admitting: Family Medicine

## 2019-12-10 NOTE — Telephone Encounter (Signed)
Pt was no show for appt 11/16/19. First occurrence. Letter mailed.  PCP,  Please reply back with corresponding letter matching appropriate follow up needs.  A - No follow up necessary B - Follow up urgent - locate patient immediately to schedule appointment. C - Follow up necessary. Contact patient and schedule visit w/in 7 days. D - Follow up necessary. Contact patient and schedule visit w/in 2-4 weeks.  E - Follow up necessary. Contact patient and schedule visit w/in 3 months.

## 2020-07-15 ENCOUNTER — Ambulatory Visit
Admission: EM | Admit: 2020-07-15 | Discharge: 2020-07-15 | Disposition: A | Discharge status: Home / Self Care | Payer: Federal, State, Local not specified - PPO | Attending: Emergency Medicine | Admitting: Emergency Medicine

## 2020-07-15 ENCOUNTER — Other Ambulatory Visit: Payer: Self-pay

## 2020-07-15 DIAGNOSIS — M7918 Myalgia, other site: Secondary | ICD-10-CM

## 2020-07-15 MED ORDER — TIZANIDINE HCL 4 MG PO TABS: 2.0000 mg | ORAL_TABLET | Freq: Four times a day (QID) | ORAL | 0 refills | Status: DC | PRN

## 2020-07-15 MED ORDER — DICLOFENAC SODIUM 50 MG PO TBEC: 50.0000 mg | DELAYED_RELEASE_TABLET | Freq: Two times a day (BID) | ORAL | 0 refills | Status: DC

## 2020-07-15 NOTE — Discharge Instructions (Signed)
Please use diclofenac twice daily with food for pain Supplement tizanidine at home which is a muscle relaxer, may cause drowsiness, do not drive or work after taking Alternate ice and heat Gentle stretching Follow-up if not improving or worsening

## 2020-07-15 NOTE — ED Provider Notes (Signed)
[facility]    CSN: [id number] Arrival date & time: 07/15/20  0845      History   Chief Complaint Chief Complaint  Patient presents with  . Rectal Pain    Pt states it not his rectal its his left butt cheek.    HPI Mark Fry is a 50 y.o. male presenting today for evaluation of left buttock pain.  Patient reports that he has had pain in his left gluteal area and radiates into his left leg.  Having problems placing pressure on this area.  Feels spasming sensation at times.  Symptoms began a few weeks ago with some lower back pain, but this is improved, continues to have discomfort in his left buttocks which comes and goes, worse with standing.  Works for [employer] and is often on his feet standing and doing heavy lifting throughout the day.  Was unable to go to work today.  Denies numbness or tingling.  Denies saddle anesthesia.  Denies change in urination or bowel movements.  Taking Tylenol ibuprofen Aleve without relief.  Patient is diabetic on insulin.  HPI  Past Medical History:  Diagnosis Date  . Diabetes mellitus without complication ([place])   . Nephrolithiasis     Patient Active Problem List   Diagnosis Date Noted  . DKA (diabetic ketoacidoses) 09/04/2019  . Uncontrolled type 2 diabetes mellitus with hyperglycemia ([place]) 04/06/2019  . Type 2 diabetes mellitus with hyperglycemia, without long-term current use of insulin ([place]) 03/30/2019  . Hyperglycemia 08/14/2014  . Hypertension 08/14/2014  . Polycythemia 08/14/2014  . CKD (chronic kidney disease), stage II 08/14/2014  . Diabetes mellitus, new onset ([place]) 08/14/2014    History reviewed. No pertinent surgical history.     Home Medications    Prior to Admission medications   Medication Sig Start Date End Date Taking? Authorizing Provider  diclofenac (VOLTAREN) 50 MG EC tablet Take 1 tablet (50 mg total) by mouth 2 (two) times daily. With food 07/15/20  Yes Hisao Doo C, PA-C  tiZANidine  (ZANAFLEX) 4 MG tablet Take 0.5-1 tablets (2-4 mg total) by mouth every 6 (six) hours as needed for muscle spasms. 07/15/20  Yes Zeki Bedrosian C, PA-C  insulin aspart (NOVOLOG) 100 UNIT/ML FlexPen Inject 7 Units into the skin 3 (three) times daily with meals. 09/06/19   Rai, Ripudeep K, MD  insulin detemir (LEVEMIR) 100 UNIT/ML FlexPen Inject 21 Units into the skin at bedtime. 09/07/19   Rai, Vernelle Emerald, MD  Insulin Pen Needle (PEN NEEDLES 3/16") 31G X 5 MM MISC Take Levemir at bedtime, NovoLog at mealtimes, flex pens as directed 09/06/19   Rai, Vernelle Emerald, MD    Family History History reviewed. No pertinent family history.  Social History Social History   Tobacco Use  . Smoking status: Never Smoker  . Smokeless tobacco: Never Used  Vaping Use  . Vaping Use: Never used  Substance Use Topics  . Alcohol use: No  . Drug use: No     Allergies   Patient has no known allergies.   Review of Systems Review of Systems  Constitutional: Negative for fatigue and fever.  Eyes: Negative for redness, itching and visual disturbance.  Respiratory: Negative for shortness of breath.   Cardiovascular: Negative for chest pain and leg swelling.  Gastrointestinal: Negative for nausea and vomiting.  Musculoskeletal: Positive for myalgias. Negative for arthralgias.  Skin: Negative for color change, rash and wound.  Neurological: Negative for dizziness, syncope, weakness, light-headedness and headaches.  Physical Exam Triage Vital Signs ED Triage Vitals  Enc Vitals Group     BP      Pulse      Resp      Temp      Temp src      SpO2      Weight      Height      Head Circumference      Peak Flow      Pain Score      Pain Loc      Pain Edu?      Excl. in [place]?    No data found.  Updated Vital Signs BP (!) 150/101 (BP Location: Left Arm)   Pulse 84   Temp 97.9 F (36.6 C) (Oral)   Resp 18   SpO2 96%   Visual Acuity Right Eye Distance:   Left Eye Distance:   Bilateral  Distance:    Right Eye Near:   Left Eye Near:    Bilateral Near:     Physical Exam Vitals and nursing note reviewed.  Constitutional:      Appearance: He is well-developed and well-nourished.     Comments: No acute distress  HENT:     Head: Normocephalic and atraumatic.     Nose: Nose normal.  Eyes:     Conjunctiva/sclera: Conjunctivae normal.  Cardiovascular:     Rate and Rhythm: Normal rate.  Pulmonary:     Effort: Pulmonary effort is normal. No respiratory distress.  Abdominal:     General: There is no distension.  Genitourinary:    Comments: No overlying skin changes induration erythema or rash noted to left buttocks Musculoskeletal:        General: Normal range of motion.     Cervical back: Neck supple.     Comments: Nontender to palpation to bilateral lower lumbar areas extending into gluteal area on the left, full active range of motion of legs, strength at hips and knees 5/5 ankle bilaterally, changes position with ease  Skin:    General: Skin is warm and dry.  Neurological:     Mental Status: He is alert and oriented to person, place, and time.  Psychiatric:        Mood and Affect: Mood and affect normal.      UC Treatments / Results  Labs (all labs ordered are listed, but only abnormal results are displayed) Labs Reviewed - No data to display  EKG   Radiology No results found.  Procedures Procedures (including critical care time)  Medications Ordered in UC Medications - No data to display  Initial Impression / Assessment and Plan / UC Course  I have reviewed the triage vital signs and the nursing notes.  Pertinent labs & imaging results that were available during my care of the patient were reviewed by me and considered in my medical decision making (see chart for details).     Treating for muscle spasm/strain, possible sciatica involvement, continuing alternative NSAIDs and supplement with tizanidine.  Alternate ice and heat, gentle  stretching.  Discussed strict return precautions. Patient verbalized understanding and is agreeable with plan.  Final Clinical Impressions(s) / UC Diagnoses   Final diagnoses:  Left buttock pain     Discharge Instructions     Please use diclofenac twice daily with food for pain Supplement tizanidine at home which is a muscle relaxer, may cause drowsiness, do not drive or work after taking Alternate ice and heat Gentle stretching Follow-up if not improving or  worsening    ED Prescriptions    Medication Sig Dispense Auth. Provider   diclofenac (VOLTAREN) 50 MG EC tablet Take 1 tablet (50 mg total) by mouth 2 (two) times daily. With food 30 tablet Adonna Horsley C, PA-C   tiZANidine (ZANAFLEX) 4 MG tablet Take 0.5-1 tablets (2-4 mg total) by mouth every 6 (six) hours as needed for muscle spasms. 30 tablet Kaoru Rezendes, [place] C, PA-C     PDMP not reviewed this encounter.   Janith Lima, [place] 07/15/20 [phone]

## 2020-07-15 NOTE — ED Triage Notes (Signed)
Pt present left butt cheek pain. Pt states that having issues sitting on the left side and pain starts in butt cheek and radiates down his left leg.

## 2021-05-16 ENCOUNTER — Inpatient Hospital Stay (HOSPITAL_COMMUNITY)
Admission: EM | Admit: 2021-05-16 | Discharge: 2021-05-20 | DRG: 638 | Disposition: A | Discharge status: Home / Self Care | Payer: Federal, State, Local not specified - PPO | Attending: Family Medicine | Admitting: Family Medicine

## 2021-05-16 ENCOUNTER — Encounter (HOSPITAL_COMMUNITY): Payer: Self-pay

## 2021-05-16 ENCOUNTER — Emergency Department (HOSPITAL_COMMUNITY): Payer: Federal, State, Local not specified - PPO

## 2021-05-16 ENCOUNTER — Other Ambulatory Visit: Payer: Self-pay

## 2021-05-16 DIAGNOSIS — Z794 Long term (current) use of insulin: Secondary | ICD-10-CM | POA: Diagnosis not present

## 2021-05-16 DIAGNOSIS — N179 Acute kidney failure, unspecified: Secondary | ICD-10-CM | POA: Diagnosis not present

## 2021-05-16 DIAGNOSIS — N281 Cyst of kidney, acquired: Secondary | ICD-10-CM | POA: Diagnosis not present

## 2021-05-16 DIAGNOSIS — K5900 Constipation, unspecified: Secondary | ICD-10-CM | POA: Diagnosis present

## 2021-05-16 DIAGNOSIS — E1310 Other specified diabetes mellitus with ketoacidosis without coma: Secondary | ICD-10-CM

## 2021-05-16 DIAGNOSIS — E1110 Type 2 diabetes mellitus with ketoacidosis without coma: Principal | ICD-10-CM | POA: Diagnosis present

## 2021-05-16 DIAGNOSIS — R0602 Shortness of breath: Secondary | ICD-10-CM | POA: Diagnosis not present

## 2021-05-16 DIAGNOSIS — E875 Hyperkalemia: Secondary | ICD-10-CM | POA: Diagnosis present

## 2021-05-16 DIAGNOSIS — E876 Hypokalemia: Secondary | ICD-10-CM | POA: Diagnosis not present

## 2021-05-16 DIAGNOSIS — Z20822 Contact with and (suspected) exposure to covid-19: Secondary | ICD-10-CM | POA: Diagnosis present

## 2021-05-16 DIAGNOSIS — R110 Nausea: Secondary | ICD-10-CM | POA: Diagnosis not present

## 2021-05-16 DIAGNOSIS — Z79899 Other long term (current) drug therapy: Secondary | ICD-10-CM | POA: Diagnosis not present

## 2021-05-16 DIAGNOSIS — Z91119 Patient's noncompliance with dietary regimen due to unspecified reason: Secondary | ICD-10-CM | POA: Diagnosis not present

## 2021-05-16 DIAGNOSIS — R739 Hyperglycemia, unspecified: Secondary | ICD-10-CM | POA: Diagnosis not present

## 2021-05-16 DIAGNOSIS — E860 Dehydration: Secondary | ICD-10-CM | POA: Diagnosis present

## 2021-05-16 DIAGNOSIS — K56699 Other intestinal obstruction unspecified as to partial versus complete obstruction: Secondary | ICD-10-CM | POA: Diagnosis not present

## 2021-05-16 DIAGNOSIS — Z87442 Personal history of urinary calculi: Secondary | ICD-10-CM

## 2021-05-16 DIAGNOSIS — I700 Atherosclerosis of aorta: Secondary | ICD-10-CM | POA: Diagnosis not present

## 2021-05-16 DIAGNOSIS — R0682 Tachypnea, not elsewhere classified: Secondary | ICD-10-CM | POA: Diagnosis not present

## 2021-05-16 DIAGNOSIS — E1165 Type 2 diabetes mellitus with hyperglycemia: Secondary | ICD-10-CM | POA: Diagnosis present

## 2021-05-16 DIAGNOSIS — E1010 Type 1 diabetes mellitus with ketoacidosis without coma: Secondary | ICD-10-CM | POA: Diagnosis not present

## 2021-05-16 DIAGNOSIS — R000 Tachycardia, unspecified: Secondary | ICD-10-CM | POA: Diagnosis not present

## 2021-05-16 LAB — URINALYSIS, ROUTINE W REFLEX MICROSCOPIC
Bacteria, UA: NONE SEEN
Bilirubin Urine: NEGATIVE
Glucose, UA: >=500 mg/dL — AB
Ketones, ur: 80 mg/dL — AB
Leukocytes,Ua: NEGATIVE
Nitrite: NEGATIVE
Protein, ur: 100 mg/dL — AB
Specific Gravity, Urine: 1.023 (ref 1.005–1.030)
pH: 5 (ref 5.0–8.0)

## 2021-05-16 LAB — CBC
HCT: 48.8 % (ref 39.0–52.0)
Hemoglobin: 15.7 g/dL (ref 13.0–17.0)
MCH: 27.9 pg (ref 26.0–34.0)
MCHC: 32.2 g/dL (ref 30.0–36.0)
MCV: 86.7 fL (ref 80.0–100.0)
Platelets: 224 10*3/uL (ref 150–400)
RBC: 5.63 MIL/uL (ref 4.22–5.81)
RDW: 15.1 % (ref 11.5–15.5)
WBC: 8.1 10*3/uL (ref 4.0–10.5)
nRBC: 0 % (ref 0.0–0.2)

## 2021-05-16 LAB — BASIC METABOLIC PANEL
BUN: 27 mg/dL — ABNORMAL HIGH (ref 6–20)
CO2: <7 mmol/L — ABNORMAL LOW (ref 22–32)
Calcium: 8.4 mg/dL — ABNORMAL LOW (ref 8.9–10.3)
Chloride: 102 mmol/L (ref 98–111)
Creatinine, Ser: 1.86 mg/dL — ABNORMAL HIGH (ref 0.61–1.24)
GFR, Estimated: 44 mL/min — ABNORMAL LOW (ref >60)
Glucose, Bld: 621 mg/dL (ref 70–99)
Potassium: 4.4 mmol/L (ref 3.5–5.1)
Sodium: 132 mmol/L — ABNORMAL LOW (ref 135–145)

## 2021-05-16 LAB — CBG MONITORING, ED
Glucose-Capillary: >600 mg/dL (ref 70–99)
Glucose-Capillary: >600 mg/dL (ref 70–99)

## 2021-05-16 MED ORDER — LACTATED RINGERS IV BOLUS
1000.0000 mL | INTRAVENOUS | Status: AC
  Administered 2021-05-17 (×2): 1000 mL via INTRAVENOUS

## 2021-05-16 NOTE — ED Triage Notes (Signed)
PER EMS: pt from home with c/o weakness, n/v/ x 1 day; constipation. LBM 7 days ago. CBG 455. 20g R hand. 4mg  zofran, 400 cc NS given en route.  132/84, HR- 120 ST, 98% RA.

## 2021-05-16 NOTE — ED Provider Notes (Signed)
[facility] DEPT Provider Note   CSN: [id number] Arrival date & time: 05/16/21  2202     History  Chief Complaint  Patient presents with   Hyperglycemia    Mark Fry is a 51 y.o. male.  The history is provided by the patient and medical records.  Hyperglycemia Mark Fry is a 51 y.o. male who presents to the Emergency Department complaining of constipation.  He states he has been constipated for the last 7 days, cannot pass gas.  Started vomiting today.  No abdominal pain.  Feels like his breathing is heavy.  No fever.    Has a hx/o DM, takes insulin and states he is taking as directed.  Blood sugar has been running high for the last two day.    Tried MOM, prune juice, epsom salt and enema without success.      Home Medications Prior to Admission medications   Medication Sig Start Date End Date Taking? Authorizing Provider  diclofenac (VOLTAREN) 50 MG EC tablet Take 1 tablet (50 mg total) by mouth 2 (two) times daily. With food 07/15/20  Yes Wieters, Hallie C, PA-C  insulin aspart (NOVOLOG) 100 UNIT/ML FlexPen Inject 7 Units into the skin 3 (three) times daily with meals. 09/06/19  Yes Rai, Ripudeep K, MD  insulin detemir (LEVEMIR) 100 UNIT/ML FlexPen Inject 21 Units into the skin at bedtime. 09/07/19  Yes Rai, Ripudeep K, MD  Insulin Pen Needle (PEN NEEDLES 3/16") 31G X 5 MM MISC Take Levemir at bedtime, NovoLog at mealtimes, flex pens as directed 09/06/19   Rai, Vernelle Emerald, MD      Allergies    Patient has no known allergies.    Review of Systems   Review of Systems  All other systems reviewed and are negative.  Physical Exam Updated Vital Signs BP 130/81    Pulse (!) 120    Temp 98 F (36.7 C) (Oral)    Resp (!) 28    Ht 6' (1.829 m)    Wt 81.6 kg    SpO2 100%    BMI 24.41 kg/m  Physical Exam Vitals and nursing note reviewed.  Constitutional:      General: He is in acute distress.     Appearance: He is well-developed. He is  ill-appearing.  HENT:     Head: Normocephalic and atraumatic.  Cardiovascular:     Rate and Rhythm: Normal rate and regular rhythm.     Heart sounds: No murmur heard. Pulmonary:     Effort: Pulmonary effort is normal. No respiratory distress.     Breath sounds: Normal breath sounds.     Comments: tachypnea Abdominal:     Palpations: Abdomen is soft.     Tenderness: There is no abdominal tenderness. There is no guarding or rebound.  Musculoskeletal:        General: No swelling or tenderness.     Comments: 2+ DP pulses bilaterally  Skin:    General: Skin is warm and dry.  Neurological:     Mental Status: He is alert and oriented to person, place, and time.  Psychiatric:        Behavior: Behavior normal.    ED Results / Procedures / Treatments   Labs (all labs ordered are listed, but only abnormal results are displayed) Labs Reviewed  BASIC METABOLIC PANEL - Abnormal; Notable for the following components:      Result Value   Sodium 132 (*)    CO2 <7 (*)  Glucose, Bld 621 (*)    BUN 27 (*)    Creatinine, Ser 1.86 (*)    Calcium 8.4 (*)    GFR, Estimated 44 (*)    All other components within normal limits  URINALYSIS, ROUTINE W REFLEX MICROSCOPIC - Abnormal; Notable for the following components:   Color, Urine STRAW (*)    APPearance HAZY (*)    Glucose, UA >=500 (*)    Hgb urine dipstick MODERATE (*)    Ketones, ur 80 (*)    Protein, ur 100 (*)    All other components within normal limits  BETA-HYDROXYBUTYRIC ACID - Abnormal; Notable for the following components:   Beta-Hydroxybutyric Acid >8.00 (*)    All other components within normal limits  BLOOD GAS, VENOUS - Abnormal; Notable for the following components:   pH, Ven 6.923 (*)    pCO2, Ven 20.8 (*)    pO2, Ven 53.5 (*)    Bicarbonate 4.1 (*)    Acid-base deficit 29.6 (*)    All other components within normal limits  BASIC METABOLIC PANEL - Abnormal; Notable for the following components:   Sodium 133 (*)     Potassium 5.7 (*)    CO2 <7 (*)    Glucose, Bld 793 (*)    BUN 36 (*)    Creatinine, Ser 2.26 (*)    Calcium 8.5 (*)    GFR, Estimated 35 (*)    All other components within normal limits  CBG MONITORING, ED - Abnormal; Notable for the following components:   Glucose-Capillary >600 (*)    All other components within normal limits  CBG MONITORING, ED - Abnormal; Notable for the following components:   Glucose-Capillary >600 (*)    All other components within normal limits  CBG MONITORING, ED - Abnormal; Notable for the following components:   Glucose-Capillary >600 (*)    All other components within normal limits  CBG MONITORING, ED - Abnormal; Notable for the following components:   Glucose-Capillary >600 (*)    All other components within normal limits  CBG MONITORING, ED - Abnormal; Notable for the following components:   Glucose-Capillary >600 (*)    All other components within normal limits  CBG MONITORING, ED - Abnormal; Notable for the following components:   Glucose-Capillary >600 (*)    All other components within normal limits  CBG MONITORING, ED - Abnormal; Notable for the following components:   Glucose-Capillary >600 (*)    All other components within normal limits  CBG MONITORING, ED - Abnormal; Notable for the following components:   Glucose-Capillary >600 (*)    All other components within normal limits  CBG MONITORING, ED - Abnormal; Notable for the following components:   Glucose-Capillary >600 (*)    All other components within normal limits  CBG MONITORING, ED - Abnormal; Notable for the following components:   Glucose-Capillary 555 (*)    All other components within normal limits  CBG MONITORING, ED - Abnormal; Notable for the following components:   Glucose-Capillary 599 (*)    All other components within normal limits  RESP PANEL BY RT-PCR (FLU A&B, COVID) ARPGX2  CBC  HEPATIC FUNCTION PANEL  HIV ANTIBODY (ROUTINE TESTING W REFLEX)  BASIC METABOLIC  PANEL  BASIC METABOLIC PANEL  BASIC METABOLIC PANEL  BASIC METABOLIC PANEL  BASIC METABOLIC PANEL  BETA-HYDROXYBUTYRIC ACID  BETA-HYDROXYBUTYRIC ACID  BETA-HYDROXYBUTYRIC ACID  HEMOGLOBIN A1C  BLOOD GAS, VENOUS    EKG None  Radiology CT Abdomen Pelvis Wo Contrast  Result Date:  05/17/2021 CLINICAL DATA:  Bowel obstruction EXAM: CT ABDOMEN AND PELVIS WITHOUT CONTRAST TECHNIQUE: Multidetector CT imaging of the abdomen and pelvis was performed following the standard protocol without IV contrast. COMPARISON:  05/12/2015 FINDINGS: Lower chest: No acute abnormality. Hepatobiliary: No focal liver abnormality is seen. No gallstones, gallbladder wall thickening, or biliary dilatation. Pancreas: Unremarkable Spleen: Unremarkable Adrenals/Urinary Tract: Adrenal glands are unremarkable. Kidneys are normal in size and position. Multiple simple cortical cyst again noted within the upper pole the right kidney. The kidneys are otherwise unremarkable. Bladder unremarkable. Stomach/Bowel: Moderate stool seen throughout the colon without evidence of obstruction. Stomach, small bowel, and large bowel are otherwise unremarkable. Appendix normal. No free intraperitoneal gas or fluid. Vascular/Lymphatic: Minimal aortoiliac atherosclerotic calcification. Abdominal vasculature is otherwise unremarkable on this noncontrast examination. No pathologic adenopathy within the abdomen and pelvis. Reproductive: Prostate is unremarkable. Other: No abdominal wall hernia. Musculoskeletal: No acute or significant osseous findings. IMPRESSION: No acute intra-abdominal pathology identified. No definite radiographic explanation for the patient's symptoms. Moderate stool burden Aortic Atherosclerosis (ICD10-I70.0). Electronically Signed   By: Fidela Salisbury M.D.   On: 05/17/2021 01:59   DG Chest 1 View  Result Date: 05/16/2021 CLINICAL DATA:  Shortness of breath. EXAM: CHEST  1 VIEW COMPARISON:  Chest x-ray 03/17/2019. FINDINGS: The  heart size and mediastinal contours are within normal limits. Both lungs are clear. The visualized skeletal structures are unremarkable. IMPRESSION: No active disease. Electronically Signed   By: Ronney Asters M.D.   On: 05/16/2021 23:32    Procedures Procedures   CRITICAL CARE Performed by: Quintella Reichert   Total critical care time: 35 minutes  Critical care time was exclusive of separately billable procedures and treating other patients.  Critical care was necessary to treat or prevent imminent or life-threatening deterioration.  Critical care was time spent personally by me on the following activities: development of treatment plan with patient and/or surrogate as well as nursing, discussions with consultants, evaluation of patient's response to treatment, examination of patient, obtaining history from patient or surrogate, ordering and performing treatments and interventions, ordering and review of laboratory studies, ordering and review of radiographic studies, pulse oximetry and re-evaluation of patient's condition.   Medications Ordered in ED Medications  dextrose 50 % solution 0-50 mL (has no administration in time range)  sodium bicarbonate 150 mEq in sterile water 1,150 mL infusion ( Intravenous New Bag/Given 05/17/21 0633)  enoxaparin (LOVENOX) injection 40 mg (has no administration in time range)  insulin regular, human (MYXREDLIN) 100 units/ 100 mL infusion (5.5 Units/hr Intravenous Restarted 05/17/21 0657)  0.9 %  sodium chloride infusion ( Intravenous New Bag/Given 05/17/21 0640)  dextrose 5 %-0.45 % sodium chloride infusion (0 mLs Intravenous Hold 05/17/21 0648)  polyethylene glycol (MIRALAX / GLYCOLAX) packet 17 g (has no administration in time range)  senna-docusate (Senokot-S) tablet 1 tablet (has no administration in time range)  lactated ringers bolus 1,000 mL (0 mLs Intravenous Stopped 05/17/21 0230)  sodium bicarbonate injection 50 mEq (50 mEq Intravenous Given 05/17/21 0306)   sodium chloride 0.9 % bolus 1,000 mL (0 mLs Intravenous Stopped 05/17/21 0640)  sodium chloride 0.9 % bolus 1,000 mL (1,000 mLs Intravenous New Bag/Given 05/17/21 3295)    ED Course/ Medical Decision Making/ A&P                           Medical Decision Making  Patient with history of diabetes here for evaluation of constipation, shortness of breath and  vomiting.  He is ill-appearing on ED presentation with tachypnea, tachycardia, dry mucous membranes.  CBG is elevated at greater than 600.  BMP is consistent with DKA with low bicarb, elevated anion gap.  He was treated with aggressive IV fluid hydration for his dehydration.  He was also treated with supplemental potassium for his hypokalemia.  He was treated with sodium bicarbonate for his severe acidosis.  He is feeling partially improved on reassessment after IV fluid hydration.  He was then started on insulin drip for his DKA.  CT abdomen pelvis was obtained given his constipation to rule out bowel obstruction.  CT is negative for acute abnormality.  Discussed with patient findings of studies recommendation for admission and he is in agreement with treatment plan.  Hospitalist consulted for admission.        Final Clinical Impression(s) / ED Diagnoses Final diagnoses:  SOB (shortness of breath)  Diabetic ketoacidosis without coma associated with other specified diabetes mellitus ([place])  AKI (acute kidney injury) [facility])    Rx / DC Orders ED Discharge Orders     None         Quintella Reichert, MD 05/17/21 [phone]

## 2021-05-16 NOTE — ED Notes (Signed)
CBG >600

## 2021-05-17 ENCOUNTER — Emergency Department (HOSPITAL_COMMUNITY): Payer: Federal, State, Local not specified - PPO

## 2021-05-17 ENCOUNTER — Encounter (HOSPITAL_COMMUNITY): Payer: Self-pay | Admitting: Internal Medicine

## 2021-05-17 DIAGNOSIS — N179 Acute kidney failure, unspecified: Secondary | ICD-10-CM

## 2021-05-17 DIAGNOSIS — Z87442 Personal history of urinary calculi: Secondary | ICD-10-CM | POA: Diagnosis not present

## 2021-05-17 DIAGNOSIS — I700 Atherosclerosis of aorta: Secondary | ICD-10-CM | POA: Diagnosis not present

## 2021-05-17 DIAGNOSIS — E875 Hyperkalemia: Secondary | ICD-10-CM

## 2021-05-17 DIAGNOSIS — Z91119 Patient's noncompliance with dietary regimen due to unspecified reason: Secondary | ICD-10-CM | POA: Diagnosis not present

## 2021-05-17 DIAGNOSIS — K5900 Constipation, unspecified: Secondary | ICD-10-CM | POA: Diagnosis present

## 2021-05-17 DIAGNOSIS — R0682 Tachypnea, not elsewhere classified: Secondary | ICD-10-CM | POA: Diagnosis present

## 2021-05-17 DIAGNOSIS — Z79899 Other long term (current) drug therapy: Secondary | ICD-10-CM | POA: Diagnosis not present

## 2021-05-17 DIAGNOSIS — E1110 Type 2 diabetes mellitus with ketoacidosis without coma: Secondary | ICD-10-CM | POA: Diagnosis present

## 2021-05-17 DIAGNOSIS — E1310 Other specified diabetes mellitus with ketoacidosis without coma: Secondary | ICD-10-CM

## 2021-05-17 DIAGNOSIS — E860 Dehydration: Secondary | ICD-10-CM | POA: Diagnosis present

## 2021-05-17 DIAGNOSIS — K56699 Other intestinal obstruction unspecified as to partial versus complete obstruction: Secondary | ICD-10-CM | POA: Diagnosis not present

## 2021-05-17 DIAGNOSIS — E1165 Type 2 diabetes mellitus with hyperglycemia: Secondary | ICD-10-CM | POA: Diagnosis not present

## 2021-05-17 DIAGNOSIS — R0600 Dyspnea, unspecified: Secondary | ICD-10-CM | POA: Insufficient documentation

## 2021-05-17 DIAGNOSIS — Z20822 Contact with and (suspected) exposure to covid-19: Secondary | ICD-10-CM | POA: Diagnosis present

## 2021-05-17 DIAGNOSIS — Z794 Long term (current) use of insulin: Secondary | ICD-10-CM | POA: Diagnosis not present

## 2021-05-17 DIAGNOSIS — N281 Cyst of kidney, acquired: Secondary | ICD-10-CM | POA: Diagnosis not present

## 2021-05-17 DIAGNOSIS — E876 Hypokalemia: Secondary | ICD-10-CM | POA: Diagnosis not present

## 2021-05-17 LAB — BETA-HYDROXYBUTYRIC ACID
Beta-Hydroxybutyric Acid: >8 mmol/L — ABNORMAL HIGH (ref 0.05–0.27)
Beta-Hydroxybutyric Acid: >8 mmol/L — ABNORMAL HIGH (ref 0.05–0.27)
Beta-Hydroxybutyric Acid: 3.12 mmol/L — ABNORMAL HIGH (ref 0.05–0.27)
Beta-Hydroxybutyric Acid: 2.09 mmol/L — ABNORMAL HIGH (ref 0.05–0.27)

## 2021-05-17 LAB — CBG MONITORING, ED
Glucose-Capillary: >600 mg/dL (ref 70–99)
Glucose-Capillary: >600 mg/dL (ref 70–99)
Glucose-Capillary: >600 mg/dL (ref 70–99)
Glucose-Capillary: >600 mg/dL (ref 70–99)
Glucose-Capillary: >600 mg/dL (ref 70–99)
Glucose-Capillary: >600 mg/dL (ref 70–99)
Glucose-Capillary: >600 mg/dL (ref 70–99)
Glucose-Capillary: 555 mg/dL (ref 70–99)
Glucose-Capillary: 599 mg/dL (ref 70–99)
Glucose-Capillary: 524 mg/dL (ref 70–99)
Glucose-Capillary: 497 mg/dL — ABNORMAL HIGH (ref 70–99)
Glucose-Capillary: 418 mg/dL — ABNORMAL HIGH (ref 70–99)
Glucose-Capillary: 489 mg/dL — ABNORMAL HIGH (ref 70–99)
Glucose-Capillary: 382 mg/dL — ABNORMAL HIGH (ref 70–99)
Glucose-Capillary: 413 mg/dL — ABNORMAL HIGH (ref 70–99)
Glucose-Capillary: 366 mg/dL — ABNORMAL HIGH (ref 70–99)
Glucose-Capillary: 306 mg/dL — ABNORMAL HIGH (ref 70–99)
Glucose-Capillary: 185 mg/dL — ABNORMAL HIGH (ref 70–99)
Glucose-Capillary: 165 mg/dL — ABNORMAL HIGH (ref 70–99)

## 2021-05-17 LAB — BASIC METABOLIC PANEL
Anion gap: 16 — ABNORMAL HIGH (ref 5–15)
Anion gap: 11 (ref 5–15)
Anion gap: 10 (ref 5–15)
Anion gap: 9 (ref 5–15)
BUN: 36 mg/dL — ABNORMAL HIGH (ref 6–20)
BUN: 36 mg/dL — ABNORMAL HIGH (ref 6–20)
BUN: 36 mg/dL — ABNORMAL HIGH (ref 6–20)
BUN: 36 mg/dL — ABNORMAL HIGH (ref 6–20)
BUN: 33 mg/dL — ABNORMAL HIGH (ref 6–20)
BUN: 31 mg/dL — ABNORMAL HIGH (ref 6–20)
CO2: <7 mmol/L — ABNORMAL LOW (ref 22–32)
CO2: <7 mmol/L — ABNORMAL LOW (ref 22–32)
CO2: 11 mmol/L — ABNORMAL LOW (ref 22–32)
CO2: 16 mmol/L — ABNORMAL LOW (ref 22–32)
CO2: 20 mmol/L — ABNORMAL LOW (ref 22–32)
CO2: 22 mmol/L (ref 22–32)
Calcium: 8.5 mg/dL — ABNORMAL LOW (ref 8.9–10.3)
Calcium: 8.2 mg/dL — ABNORMAL LOW (ref 8.9–10.3)
Calcium: 8.1 mg/dL — ABNORMAL LOW (ref 8.9–10.3)
Calcium: 7.9 mg/dL — ABNORMAL LOW (ref 8.9–10.3)
Calcium: 8.3 mg/dL — ABNORMAL LOW (ref 8.9–10.3)
Calcium: 8.2 mg/dL — ABNORMAL LOW (ref 8.9–10.3)
Chloride: 98 mmol/L (ref 98–111)
Chloride: 105 mmol/L (ref 98–111)
Chloride: 113 mmol/L — ABNORMAL HIGH (ref 98–111)
Chloride: 114 mmol/L — ABNORMAL HIGH (ref 98–111)
Chloride: 113 mmol/L — ABNORMAL HIGH (ref 98–111)
Chloride: 110 mmol/L (ref 98–111)
Creatinine, Ser: 2.26 mg/dL — ABNORMAL HIGH (ref 0.61–1.24)
Creatinine, Ser: 2.49 mg/dL — ABNORMAL HIGH (ref 0.61–1.24)
Creatinine, Ser: 2.09 mg/dL — ABNORMAL HIGH (ref 0.61–1.24)
Creatinine, Ser: 2.01 mg/dL — ABNORMAL HIGH (ref 0.61–1.24)
Creatinine, Ser: 1.72 mg/dL — ABNORMAL HIGH (ref 0.61–1.24)
Creatinine, Ser: 1.81 mg/dL — ABNORMAL HIGH (ref 0.61–1.24)
GFR, Estimated: 35 mL/min — ABNORMAL LOW (ref >60)
GFR, Estimated: 31 mL/min — ABNORMAL LOW (ref >60)
GFR, Estimated: 38 mL/min — ABNORMAL LOW (ref >60)
GFR, Estimated: 40 mL/min — ABNORMAL LOW (ref >60)
GFR, Estimated: 48 mL/min — ABNORMAL LOW (ref >60)
GFR, Estimated: 46 mL/min — ABNORMAL LOW (ref >60)
Glucose, Bld: 793 mg/dL (ref 70–99)
Glucose, Bld: 556 mg/dL (ref 70–99)
Glucose, Bld: 410 mg/dL — ABNORMAL HIGH (ref 70–99)
Glucose, Bld: 189 mg/dL — ABNORMAL HIGH (ref 70–99)
Glucose, Bld: 151 mg/dL — ABNORMAL HIGH (ref 70–99)
Glucose, Bld: 128 mg/dL — ABNORMAL HIGH (ref 70–99)
Potassium: 5.7 mmol/L — ABNORMAL HIGH (ref 3.5–5.1)
Potassium: 5.6 mmol/L — ABNORMAL HIGH (ref 3.5–5.1)
Potassium: 4.3 mmol/L (ref 3.5–5.1)
Potassium: 4.1 mmol/L (ref 3.5–5.1)
Potassium: 3.5 mmol/L (ref 3.5–5.1)
Potassium: 3.2 mmol/L — ABNORMAL LOW (ref 3.5–5.1)
Sodium: 133 mmol/L — ABNORMAL LOW (ref 135–145)
Sodium: 136 mmol/L (ref 135–145)
Sodium: 140 mmol/L (ref 135–145)
Sodium: 141 mmol/L (ref 135–145)
Sodium: 143 mmol/L (ref 135–145)
Sodium: 141 mmol/L (ref 135–145)

## 2021-05-17 LAB — BLOOD GAS, VENOUS
Acid-base deficit: 21.5 mmol/L — ABNORMAL HIGH (ref 0.0–2.0)
Acid-base deficit: 29.6 mmol/L — ABNORMAL HIGH (ref 0.0–2.0)
Bicarbonate: 4.1 mmol/L — ABNORMAL LOW (ref 20.0–28.0)
Bicarbonate: 7.2 mmol/L — ABNORMAL LOW (ref 20.0–28.0)
O2 Saturation: 56.7 %
O2 Saturation: 78 %
Patient temperature: 98.6
Patient temperature: 98.6
pCO2, Ven: 20.8 mmHg — ABNORMAL LOW (ref 44.0–60.0)
pCO2, Ven: 23.3 mmHg — ABNORMAL LOW (ref 44.0–60.0)
pH, Ven: 6.923 — CL (ref 7.250–7.430)
pH, Ven: 7.116 — CL (ref 7.250–7.430)
pO2, Ven: 53.5 mmHg — ABNORMAL HIGH (ref 32.0–45.0)
pO2, Ven: <31 mmHg — CL (ref 32.0–45.0)

## 2021-05-17 LAB — HEPATIC FUNCTION PANEL
ALT: 27 U/L (ref 0–44)
AST: 24 U/L (ref 15–41)
Albumin: 3.7 g/dL (ref 3.5–5.0)
Alkaline Phosphatase: 80 U/L (ref 38–126)
Bilirubin, Direct: 0.5 mg/dL — ABNORMAL HIGH (ref 0.0–0.2)
Indirect Bilirubin: 2.1 mg/dL — ABNORMAL HIGH (ref 0.3–0.9)
Total Bilirubin: 2.6 mg/dL — ABNORMAL HIGH (ref 0.3–1.2)
Total Protein: 7.1 g/dL (ref 6.5–8.1)

## 2021-05-17 LAB — RESP PANEL BY RT-PCR (FLU A&B, COVID) ARPGX2
Influenza A by PCR: NEGATIVE
Influenza B by PCR: NEGATIVE
SARS Coronavirus 2 by RT PCR: NEGATIVE

## 2021-05-17 LAB — GLUCOSE, CAPILLARY
Glucose-Capillary: 161 mg/dL — ABNORMAL HIGH (ref 70–99)
Glucose-Capillary: 127 mg/dL — ABNORMAL HIGH (ref 70–99)
Glucose-Capillary: 133 mg/dL — ABNORMAL HIGH (ref 70–99)
Glucose-Capillary: 137 mg/dL — ABNORMAL HIGH (ref 70–99)
Glucose-Capillary: 154 mg/dL — ABNORMAL HIGH (ref 70–99)
Glucose-Capillary: 139 mg/dL — ABNORMAL HIGH (ref 70–99)
Glucose-Capillary: 131 mg/dL — ABNORMAL HIGH (ref 70–99)

## 2021-05-17 LAB — MRSA NEXT GEN BY PCR, NASAL: MRSA by PCR Next Gen: NOT DETECTED

## 2021-05-17 LAB — HIV ANTIBODY (ROUTINE TESTING W REFLEX): HIV Screen 4th Generation wRfx: NONREACTIVE

## 2021-05-17 MED ORDER — POLYETHYLENE GLYCOL 3350 17 G PO PACK
17.0000 g | PACK | Freq: Two times a day (BID) | ORAL | Status: DC
  Administered 2021-05-18 – 2021-05-19 (×4): 17 g via ORAL
  Filled 2021-05-17 (×5): qty 1

## 2021-05-17 MED ORDER — DEXTROSE IN LACTATED RINGERS 5 % IV SOLN: INTRAVENOUS | Status: DC

## 2021-05-17 MED ORDER — POLYETHYLENE GLYCOL 3350 17 G PO PACK
17.0000 g | PACK | Freq: Every day | ORAL | Status: DC
  Administered 2021-05-17: 17 g via ORAL
  Filled 2021-05-17: qty 1

## 2021-05-17 MED ORDER — LACTATED RINGERS IV BOLUS: 1000.0000 mL | INTRAVENOUS | Status: AC

## 2021-05-17 MED ORDER — STERILE WATER FOR INJECTION IV SOLN
INTRAVENOUS | Status: DC
  Filled 2021-05-17 (×3): qty 150
  Filled 2021-05-17 (×2): qty 1000

## 2021-05-17 MED ORDER — SENNOSIDES-DOCUSATE SODIUM 8.6-50 MG PO TABS: 1.0000 | ORAL_TABLET | Freq: Every evening | ORAL | Status: DC | PRN

## 2021-05-17 MED ORDER — SODIUM CHLORIDE 0.9 % IV BOLUS
1000.0000 mL | Freq: Once | INTRAVENOUS | Status: AC
  Administered 2021-05-17: 1000 mL via INTRAVENOUS

## 2021-05-17 MED ORDER — SODIUM BICARBONATE 8.4 % IV SOLN
50.0000 meq | Freq: Once | INTRAVENOUS | Status: AC
  Administered 2021-05-17: 50 meq via INTRAVENOUS
  Filled 2021-05-17: qty 50

## 2021-05-17 MED ORDER — SODIUM CHLORIDE 0.9 % IV BOLUS
2000.0000 mL | Freq: Once | INTRAVENOUS | Status: AC
  Administered 2021-05-17: 2000 mL via INTRAVENOUS

## 2021-05-17 MED ORDER — DEXTROSE-NACL 5-0.45 % IV SOLN: INTRAVENOUS | Status: DC

## 2021-05-17 MED ORDER — SENNOSIDES-DOCUSATE SODIUM 8.6-50 MG PO TABS
1.0000 | ORAL_TABLET | Freq: Every day | ORAL | Status: DC
  Administered 2021-05-18 – 2021-05-19 (×2): 1 via ORAL
  Filled 2021-05-17 (×3): qty 1

## 2021-05-17 MED ORDER — SORBITOL 70 % SOLN
960.0000 mL | TOPICAL_OIL | Freq: Once | ORAL | Status: AC
  Administered 2021-05-17: 960 mL via RECTAL
  Filled 2021-05-17: qty 473

## 2021-05-17 MED ORDER — CHLORHEXIDINE GLUCONATE CLOTH 2 % EX PADS
6.0000 | MEDICATED_PAD | Freq: Every day | CUTANEOUS | Status: DC
  Administered 2021-05-18 – 2021-05-19 (×2): 6 via TOPICAL

## 2021-05-17 MED ORDER — POTASSIUM CHLORIDE 10 MEQ/100ML IV SOLN
10.0000 meq | INTRAVENOUS | Status: DC
  Administered 2021-05-17 (×2): 10 meq via INTRAVENOUS
  Filled 2021-05-17 (×2): qty 100

## 2021-05-17 MED ORDER — INSULIN REGULAR(HUMAN) IN NACL 100-0.9 UT/100ML-% IV SOLN
INTRAVENOUS | Status: DC
  Administered 2021-05-17: 5.5 [IU]/h via INTRAVENOUS
  Filled 2021-05-17: qty 100

## 2021-05-17 MED ORDER — LACTATED RINGERS IV SOLN: INTRAVENOUS | Status: DC

## 2021-05-17 MED ORDER — ENOXAPARIN SODIUM 40 MG/0.4ML IJ SOSY
40.0000 mg | PREFILLED_SYRINGE | INTRAMUSCULAR | Status: DC
  Administered 2021-05-17 – 2021-05-20 (×4): 40 mg via SUBCUTANEOUS
  Filled 2021-05-17 (×4): qty 0.4

## 2021-05-17 MED ORDER — POTASSIUM CHLORIDE 10 MEQ/100ML IV SOLN
10.0000 meq | INTRAVENOUS | Status: AC
  Administered 2021-05-18 (×4): 10 meq via INTRAVENOUS
  Filled 2021-05-17 (×4): qty 100

## 2021-05-17 MED ORDER — SODIUM CHLORIDE 0.9 % IV SOLN: INTRAVENOUS | Status: DC

## 2021-05-17 MED ORDER — INSULIN REGULAR(HUMAN) IN NACL 100-0.9 UT/100ML-% IV SOLN
INTRAVENOUS | Status: DC
  Administered 2021-05-17: 2.8 [IU]/h via INTRAVENOUS
  Filled 2021-05-17: qty 100

## 2021-05-17 MED ORDER — DEXTROSE 50 % IV SOLN: 0.0000 mL | INTRAVENOUS | Status: DC | PRN

## 2021-05-17 NOTE — Progress Notes (Signed)
Inpatient Diabetes Program Recommendations  AACE/ADA: New Consensus Statement on Inpatient Glycemic Control (2015)  Target Ranges:  Prepandial:   less than 140 mg/dL      Peak postprandial:   less than 180 mg/dL (1-2 hours)      Critically ill patients:  140 - 180 mg/dL   Lab Results  Component Value Date   MBWGYK 599 (H) 05/17/2021   HGBA1C 14.4 (H) 09/05/2019    Review of Glycemic Control  Diabetes history: DM 2 Outpatient Diabetes medications: Novolog (prescribed Levemir in the past) Current orders for Inpatient glycemic control:  IV insulin/Endotool/DKA  Last PCP visit 08/2019. Pt will need and desires new PCP for follow up  Pt states using insulin from [facility] but reports having Levemir at home but not taking. Has 2 boxes of Levemir left over (expires Oct 2023) Has one regular insulin pen left  Spoke with pt at bedside regarding glucose control at home. Pt had conflicting personalities with his PCP and will need a new one. Pt reports checking his glucose and covering with Novolog based on his numbers like a sliding scale. Pt would like to get back on an insulin regimen. Pt reports weight loss. Discussed glucose and A1c goals. Encouraged compliance with food and exercise.   Described the use of basal insulin in glucose control and short acting insulin. Discuss the need for pt to be on basal insulin at this time. Pt agreeable to taking basal insulin.  Discussed the process of getting pt off IV insulin and onto a SQ regimen. Pt reported with insurance his copay was going to be $200-500 2 years ago. Will need to make sure pt can afford regimen at d/c. Pt uses [employer] meter for glucose checks. Encouraged compliance with glucose monitoring and taking insulin. Encouraged close follow up with PCP.  D/c needs: Insulin pen needles [phone]   Thanks,  Tama Headings RN, MSN, BC-ADM Inpatient Diabetes Coordinator Team Pager [phone] (8a-5p)

## 2021-05-17 NOTE — Progress Notes (Signed)
°  Progress Note Patient: Mark Fry OEV:[id number] DOB: 04-12-1971 DOA: 05/16/2021     0 DOS: the patient was seen and examined on 05/17/2021   Brief hospital course: 51 year old man PMH insulin-dependent diabetes type 2 presented with generalized weakness, shortness of breath, 1 episode of vomiting and 1 week of constipation.  Markedly hyperglycemic, low pH, treated with IV fluids, IV insulin and admitted for DKA. --1/5 feels a lot better today.  No vomiting.  Still has not had a bowel movement for about a week now.  No pain.  Assessment and Plan * DKA (diabetic ketoacidosis) ([place])- (present on admission) -- Severe on presentation, most recent VBG shows improvement in pH.  On exam he appears well, there is no tachypnea or signs of distress.  Heart rate stable. -- He remains significantly acidotic with CO2 undetectable, glucose over 500, anion gap not calculated -- Aggressive IV fluids, continue insulin infusion, monitor BMP and clinical status.  He is going to require significant monitoring and intervention, will change to inpatient status.  AKI (acute kidney injury) ([place]) -- Baseline creatinine around 1, worse this morning. -- Reports frequent urination.  Appears dry.  Further aggressive fluids today.  Check urine sodium.  Trend creatinine.  If worsens, check renal ultrasound.  Hyperkalemia -- Modest, main treatment will be fluids, avoid any potassium supplementation, trend with BMP  Constipation -- No bowel movement for about 1 week.  No vomiting now.  CT no evidence of obstruction.  No clinical signs or symptoms to suggest worrisome process.  Exam is benign.  Plan aggressive bowel regimen.  Aortic atherosclerosis ([place]) -- No inpatient treatment indicated     Subjective:  Feels much better today.  No vomiting today.  Still no bowel movement.  No pain.  Objective Vitals reviewed, notable for tachycardia 105, afebrile 98, respiratory rate 12, BP 140/89 Physical Exam Constitutional:       General: He is not in acute distress.    Appearance: He is not toxic-appearing.  Cardiovascular:     Rate and Rhythm: Tachycardia present.     Heart sounds: No murmur heard. Pulmonary:     Effort: Pulmonary effort is normal. No respiratory distress.     Breath sounds: No wheezing, rhonchi or rales.  Abdominal:     General: Abdomen is flat.     Palpations: Abdomen is soft. There is no mass.     Tenderness: There is no abdominal tenderness.     Hernia: No hernia is present.  Musculoskeletal:     Right lower leg: No edema.     Left lower leg: No edema.  Neurological:     General: No focal deficit present.     Mental Status: He is alert.  Psychiatric:        Mood and Affect: Mood normal.        Behavior: Behavior normal.    Data Reviewed:  Venous pH 7.116, potassium 5.6, CO2 less than 7, glucose 556, creatinine up to 2.49, BUN 36  Family Communication: sister at bedside  Disposition: Status is: Observation  Patient has severe DKA, still on insulin infusion, requires aggressive IV fluids, close monitoring in stepdown unit, he meets inpatient criteria, changed to inpatient.       Time spent: 55 minutes  Author: Murray Hodgkins, MD 05/17/2021 10:16 AM  For on call review [email or web address].

## 2021-05-17 NOTE — Hospital Course (Addendum)
51 year old man PMH insulin-dependent diabetes type 2 presented with generalized weakness, shortness of breath, 1 episode of vomiting and 1 week of constipation.  Markedly hyperglycemic, low pH, treated with IV fluids, IV insulin and admitted for DKA.  DKA was slow to correct, eventually transitioned off insulin infusion without event evening 1/6. -- 1/7 continues to feel better, remains hyperglycemic, continue to adjust insulin, likely home 1/8.  Insulin pen needles [phone] Either basal bolus insulin or 70/30 bid

## 2021-05-17 NOTE — Assessment & Plan Note (Signed)
--   Modest, main treatment will be fluids, avoid any potassium supplementation, trend with BMP

## 2021-05-17 NOTE — Assessment & Plan Note (Signed)
--   Severe on presentation, most recent VBG shows improvement in pH.  On exam he appears well, there is no tachypnea or signs of distress.  Heart rate stable. -- He remains significantly acidotic with CO2 undetectable, glucose over 500, anion gap not calculated -- Aggressive IV fluids, continue insulin infusion, monitor BMP and clinical status.  He is going to require significant monitoring and intervention, will change to inpatient status.

## 2021-05-17 NOTE — Assessment & Plan Note (Signed)
--   No inpatient treatment indicated

## 2021-05-17 NOTE — Assessment & Plan Note (Signed)
--   No bowel movement for about 1 week.  No vomiting now.  CT no evidence of obstruction.  No clinical signs or symptoms to suggest worrisome process.  Exam is benign.  Plan aggressive bowel regimen.

## 2021-05-17 NOTE — Assessment & Plan Note (Signed)
--   Baseline creatinine around 1, worse this morning. -- Reports frequent urination.  Appears dry.  Further aggressive fluids today.  Check urine sodium.  Trend creatinine.  If worsens, check renal ultrasound.

## 2021-05-17 NOTE — H&P (Signed)
History and Physical    Barnard Sharps ZJQ:[id number] DOB: 01/03/1971 DOA: 05/16/2021  PCP: Libby Maw, MD Patient coming from: Home  Chief Complaint: Generalized weakness  HPI: Mark Fry is a 51 y.o. male with medical history significant of poorly controlled insulin-dependent type 2 diabetes presented to the ED complaining of generalized weakness, shortness of breath, nausea, vomiting, and constipation.  In the ED, not febrile but tachycardic with heart rate in the 110s to 120s.  Not hypotensive.  Not hypoxic.  Labs showing no leukocytosis.  Hemoglobin stable.  Blood glucose 621.  Bicarb <7.  UA with ketones.  Beta hydroxybutyric acid >8.0.  VBG with pH 6.9.  Potassium 4.4.  BUN 27, creatinine 1.8 (baseline 1.0).  COVID and influenza PCR negative.  Chest x-ray showing no active disease.  CT abdomen pelvis showing moderate stool burden and no acute finding.  Patient was given 1 L LR bolus and IV potassium 20 mEq.  He was started on IV insulin and IV fluids/LR per DKA protocol. Repeat BMP showing potassium 5.7, blood glucose 793, bicarb <7, BUN 36, creatinine 2.2.  Patient was given additional 1 L normal saline bolus.  Patient reports 1 week history of constipation.  He has tried taking several over-the-counter laxatives but still not having bowel movements.  He checked his blood sugar 2 days ago and it was above 600.  Yesterday he had fatigue and 1 episode of vomiting.  He was previously admitted for DKA in April 2021 and has not seen a primary care physician since then.  He uses NovoLog as needed with meals and does not use Levemir.  Denies drinking any sweet beverages but does report eating fruit including apples and watermelon.  Reports shortness of breath since yesterday.  Denies fevers, cough, or chest pain.  Denies abdominal pain.  Review of Systems:  All systems reviewed and apart from history of presenting illness, are negative.  Past Medical History:  Diagnosis Date    Diabetes mellitus without complication ([place])    Nephrolithiasis     History reviewed. No pertinent surgical history.   reports that he has never smoked. He has never used smokeless tobacco. He reports that he does not drink alcohol and does not use drugs.  No Known Allergies  History reviewed. No pertinent family history.  Prior to Admission medications   Medication Sig Start Date End Date Taking? Authorizing Provider  diclofenac (VOLTAREN) 50 MG EC tablet Take 1 tablet (50 mg total) by mouth 2 (two) times daily. With food 07/15/20  Yes Wieters, Hallie C, PA-C  insulin aspart (NOVOLOG) 100 UNIT/ML FlexPen Inject 7 Units into the skin 3 (three) times daily with meals. 09/06/19  Yes Rai, Ripudeep K, MD  insulin detemir (LEVEMIR) 100 UNIT/ML FlexPen Inject 21 Units into the skin at bedtime. 09/07/19  Yes Rai, Ripudeep K, MD  Insulin Pen Needle (PEN NEEDLES 3/16") 31G X 5 MM MISC Take Levemir at bedtime, NovoLog at mealtimes, flex pens as directed 09/06/19   Mendel Corning, MD    Physical Exam: Vitals:   05/17/21 0000 05/17/21 0025 05/17/21 0220 05/17/21 0530  BP: (!) 143/83 (!) 145/91 135/76 130/81  Pulse: (!) 117 (!) 123 (!) 111 (!) 120  Resp: 19 20 20  (!) 28  Temp:      TempSrc:      SpO2: 100% 100% 100% 100%  Weight:      Height:        Physical Exam Constitutional:  General: He is not in acute distress. HENT:     Head: Normocephalic and atraumatic.     Mouth/Throat:     Comments: Very dry mucous membranes Eyes:     Extraocular Movements: Extraocular movements intact.     Conjunctiva/sclera: Conjunctivae normal.  Cardiovascular:     Rate and Rhythm: Regular rhythm. Tachycardia present.     Pulses: Normal pulses.  Pulmonary:     Effort: Pulmonary effort is normal. No respiratory distress.     Breath sounds: Normal breath sounds. No wheezing or rales.  Abdominal:     General: Bowel sounds are normal. There is no distension.     Palpations: Abdomen is soft.      Tenderness: There is no abdominal tenderness. There is no guarding or rebound.  Musculoskeletal:        General: No swelling or tenderness.     Cervical back: Normal range of motion and neck supple.  Skin:    General: Skin is warm and dry.  Neurological:     General: No focal deficit present.     Mental Status: He is alert and oriented to person, place, and time.     Labs on Admission: I have personally reviewed following labs and imaging studies  CBC: Recent Labs  Lab 05/16/21 2233  WBC 8.1  HGB 15.7  HCT 48.8  MCV 86.7  PLT 378   Basic Metabolic Panel: Recent Labs  Lab 05/16/21 2233 05/17/21 0359  NA 132* 133*  K 4.4 5.7*  CL 102 98  CO2 <7* <7*  GLUCOSE 621* 793*  BUN 27* 36*  CREATININE 1.86* 2.26*  CALCIUM 8.4* 8.5*   GFR: Estimated Creatinine Clearance: 43.9 mL/min (A) (by C-G formula based on SCr of 2.26 mg/dL (H)). Liver Function Tests: No results for input(s): AST, ALT, ALKPHOS, BILITOT, PROT, ALBUMIN in the last 168 hours. No results for input(s): LIPASE, AMYLASE in the last 168 hours. No results for input(s): AMMONIA in the last 168 hours. Coagulation Profile: No results for input(s): INR, PROTIME in the last 168 hours. Cardiac Enzymes: No results for input(s): CKTOTAL, CKMB, CKMBINDEX, TROPONINI in the last 168 hours. BNP (last 3 results) No results for input(s): PROBNP in the last 8760 hours. HbA1C: No results for input(s): HGBA1C in the last 72 hours. CBG: Recent Labs  Lab 05/17/21 0433 05/17/21 0501 05/17/21 0538 05/17/21 0608 05/17/21 0656  GLUCAP >600* >600* >600* >600* 555*   Lipid Profile: No results for input(s): CHOL, HDL, LDLCALC, TRIG, CHOLHDL, LDLDIRECT in the last 72 hours. Thyroid Function Tests: No results for input(s): TSH, T4TOTAL, FREET4, T3FREE, THYROIDAB in the last 72 hours. Anemia Panel: No results for input(s): VITAMINB12, FOLATE, FERRITIN, TIBC, IRON, RETICCTPCT in the last 72 hours. Urine analysis:    Component  Value Date/Time   COLORURINE STRAW (A) 05/16/2021 2330   APPEARANCEUR HAZY (A) 05/16/2021 2330   LABSPEC 1.023 05/16/2021 2330   PHURINE 5.0 05/16/2021 2330   GLUCOSEU >=500 (A) 05/16/2021 2330   GLUCOSEU 500 (A) 05/04/2019 1007   HGBUR MODERATE (A) 05/16/2021 2330   BILIRUBINUR NEGATIVE 05/16/2021 2330   KETONESUR 80 (A) 05/16/2021 2330   PROTEINUR 100 (A) 05/16/2021 2330   UROBILINOGEN 0.2 05/04/2019 1007   NITRITE NEGATIVE 05/16/2021 2330   LEUKOCYTESUR NEGATIVE 05/16/2021 2330    Radiological Exams on Admission: CT Abdomen Pelvis Wo Contrast  Result Date: 05/17/2021 CLINICAL DATA:  Bowel obstruction EXAM: CT ABDOMEN AND PELVIS WITHOUT CONTRAST TECHNIQUE: Multidetector CT imaging of the abdomen and pelvis  was performed following the standard protocol without IV contrast. COMPARISON:  05/12/2015 FINDINGS: Lower chest: No acute abnormality. Hepatobiliary: No focal liver abnormality is seen. No gallstones, gallbladder wall thickening, or biliary dilatation. Pancreas: Unremarkable Spleen: Unremarkable Adrenals/Urinary Tract: Adrenal glands are unremarkable. Kidneys are normal in size and position. Multiple simple cortical cyst again noted within the upper pole the right kidney. The kidneys are otherwise unremarkable. Bladder unremarkable. Stomach/Bowel: Moderate stool seen throughout the colon without evidence of obstruction. Stomach, small bowel, and large bowel are otherwise unremarkable. Appendix normal. No free intraperitoneal gas or fluid. Vascular/Lymphatic: Minimal aortoiliac atherosclerotic calcification. Abdominal vasculature is otherwise unremarkable on this noncontrast examination. No pathologic adenopathy within the abdomen and pelvis. Reproductive: Prostate is unremarkable. Other: No abdominal wall hernia. Musculoskeletal: No acute or significant osseous findings. IMPRESSION: No acute intra-abdominal pathology identified. No definite radiographic explanation for the patient's symptoms.  Moderate stool burden Aortic Atherosclerosis (ICD10-I70.0). Electronically Signed   By: Fidela Salisbury M.D.   On: 05/17/2021 01:59   DG Chest 1 View  Result Date: 05/16/2021 CLINICAL DATA:  Shortness of breath. EXAM: CHEST  1 VIEW COMPARISON:  Chest x-ray 03/17/2019. FINDINGS: The heart size and mediastinal contours are within normal limits. Both lungs are clear. The visualized skeletal structures are unremarkable. IMPRESSION: No active disease. Electronically Signed   By: Ronney Asters M.D.   On: 05/16/2021 23:32    Assessment/Plan Principal Problem:   DKA (diabetic ketoacidosis) ([place]) Active Problems:   Hyperkalemia   AKI (acute kidney injury) ([place])   Dyspnea   Constipation   Severe DKA Due to noncompliance and dietary indiscretion.  Patient with history of poorly controlled insulin-dependent type 2 diabetes.  Last A1c 14.4 in April 2021 when he was admitted for DKA.  At that time, he was discharged with NovoLog 7 units 3 times daily and Levemir 21 units daily.  Patient tells me he has Levemir at home but he is not taking it.  He is also not taking NovoLog consistently, only as needed.  Reports eating fruit such as apples and watermelon.  Found to be in severe DKA on arrival to the ED with blood glucose 621, bicarb <7, UA with ketones, beta hydroxybutyric acid >8.0, and VBG with pH 6.9.  Patient was given 1 L LR bolus, 1 amp sodium bicarb, and started on IV insulin and IV fluids/LR per DKA protocol in the ED.  Repeat BMP showing blood glucose 793, bicarb <7.  He was given additional 1 L normal saline bolus.  Blood glucose continues to be elevated >600.  He appears very dry on exam, persistently tachycardic with heart rate in the 120s. -Keep n.p.o. Continue IV insulin.  Give additional 1 L normal saline bolus.  Discontinue LR and instead start normal saline at 125 cc/h.  When CBG less than 250, switch to D5-0.45% saline at 125 cc/h.  Start bicarb drip at 125 cc/h.  Repeat VBG.  Check A1c.  Serial  BMP and beta hydroxybutyric acid per DKA protocol.  When DKA resolves, initiate diet and subcutaneous insulin.  Continue IV insulin for an additional 1 to 2 hours to ensure adequate plasma insulin levels.  Diabetes coordinator consulted.  Hyperkalemia Potassium was 4.4 on initial labs and patient was given IV potassium 20 mEq in the ED.  Repeat labs showing potassium 5.7 without visible hemolysis. -Continue IV insulin, IV fluids switched from LR to normal saline.  Monitor BMP every 4 hours.  Order EKG.  AKI Likely prerenal from dehydration.  He appears  very dry on exam.  BUN 27, creatinine 1.8 on initial labs and repeat labs showing BUN 36 with creatinine 2.2.  Baseline creatinine is 1.0.  CT without evidence of obstructive uropathy. -Continue aggressive IV fluid hydration and monitor renal function closely.  Avoid nephrotoxic agents.  Dyspnea He is mildly tachypneic in the setting of severe DKA.  Not hypoxic.  Chest x-ray showing no active disease. -Continue to monitor  Vomiting and constipation Patient reports 1 week history of constipation and 1 episode of vomiting at home yesterday, no recurrence in the ED.  Abdominal exam benign.  CT abdomen pelvis showing moderate stool burden and no acute finding. -MiraLAX scheduled, Senokot-S as needed  DVT prophylaxis: Lovenox Code Status: Full code Family Communication: Sister at bedside. Disposition Plan: Status is: Observation  The patient remains OBS appropriate and will d/c before 2 midnights.  Level of care: Level of care: Stepdown  The medical decision making on this patient was of high complexity and the patient is at high risk for clinical deterioration, therefore this is a level 3 visit.  Shela Leff MD [facility]  If 7PM-7AM, please contact night-coverage [email or web address]  05/17/2021, 7:17 AM

## 2021-05-18 ENCOUNTER — Encounter (HOSPITAL_COMMUNITY): Payer: Self-pay | Admitting: Family Medicine

## 2021-05-18 DIAGNOSIS — E1310 Other specified diabetes mellitus with ketoacidosis without coma: Secondary | ICD-10-CM | POA: Diagnosis not present

## 2021-05-18 DIAGNOSIS — E876 Hypokalemia: Secondary | ICD-10-CM | POA: Diagnosis not present

## 2021-05-18 DIAGNOSIS — N179 Acute kidney failure, unspecified: Secondary | ICD-10-CM | POA: Diagnosis not present

## 2021-05-18 LAB — GLUCOSE, CAPILLARY
Glucose-Capillary: 114 mg/dL — ABNORMAL HIGH (ref 70–99)
Glucose-Capillary: 116 mg/dL — ABNORMAL HIGH (ref 70–99)
Glucose-Capillary: 116 mg/dL — ABNORMAL HIGH (ref 70–99)
Glucose-Capillary: 132 mg/dL — ABNORMAL HIGH (ref 70–99)
Glucose-Capillary: 132 mg/dL — ABNORMAL HIGH (ref 70–99)
Glucose-Capillary: 135 mg/dL — ABNORMAL HIGH (ref 70–99)
Glucose-Capillary: 136 mg/dL — ABNORMAL HIGH (ref 70–99)
Glucose-Capillary: 147 mg/dL — ABNORMAL HIGH (ref 70–99)
Glucose-Capillary: 154 mg/dL — ABNORMAL HIGH (ref 70–99)
Glucose-Capillary: 185 mg/dL — ABNORMAL HIGH (ref 70–99)
Glucose-Capillary: 200 mg/dL — ABNORMAL HIGH (ref 70–99)
Glucose-Capillary: 210 mg/dL — ABNORMAL HIGH (ref 70–99)
Glucose-Capillary: 219 mg/dL — ABNORMAL HIGH (ref 70–99)
Glucose-Capillary: 227 mg/dL — ABNORMAL HIGH (ref 70–99)
Glucose-Capillary: 240 mg/dL — ABNORMAL HIGH (ref 70–99)
Glucose-Capillary: 244 mg/dL — ABNORMAL HIGH (ref 70–99)
Glucose-Capillary: 247 mg/dL — ABNORMAL HIGH (ref 70–99)
Glucose-Capillary: 250 mg/dL — ABNORMAL HIGH (ref 70–99)
Glucose-Capillary: 266 mg/dL — ABNORMAL HIGH (ref 70–99)
Glucose-Capillary: 268 mg/dL — ABNORMAL HIGH (ref 70–99)
Glucose-Capillary: 273 mg/dL — ABNORMAL HIGH (ref 70–99)
Glucose-Capillary: 279 mg/dL — ABNORMAL HIGH (ref 70–99)
Glucose-Capillary: 294 mg/dL — ABNORMAL HIGH (ref 70–99)
Glucose-Capillary: 301 mg/dL — ABNORMAL HIGH (ref 70–99)
Glucose-Capillary: 313 mg/dL — ABNORMAL HIGH (ref 70–99)

## 2021-05-18 LAB — BASIC METABOLIC PANEL
Anion gap: 12 (ref 5–15)
Anion gap: 11 (ref 5–15)
Anion gap: 8 (ref 5–15)
Anion gap: 4 — ABNORMAL LOW (ref 5–15)
Anion gap: 8 (ref 5–15)
BUN: 29 mg/dL — ABNORMAL HIGH (ref 6–20)
BUN: 27 mg/dL — ABNORMAL HIGH (ref 6–20)
BUN: 26 mg/dL — ABNORMAL HIGH (ref 6–20)
BUN: 28 mg/dL — ABNORMAL HIGH (ref 6–20)
BUN: 26 mg/dL — ABNORMAL HIGH (ref 6–20)
CO2: 21 mmol/L — ABNORMAL LOW (ref 22–32)
CO2: 22 mmol/L (ref 22–32)
CO2: 22 mmol/L (ref 22–32)
CO2: 22 mmol/L (ref 22–32)
CO2: 23 mmol/L (ref 22–32)
Calcium: 8.1 mg/dL — ABNORMAL LOW (ref 8.9–10.3)
Calcium: 7.9 mg/dL — ABNORMAL LOW (ref 8.9–10.3)
Calcium: 7.6 mg/dL — ABNORMAL LOW (ref 8.9–10.3)
Calcium: 7.5 mg/dL — ABNORMAL LOW (ref 8.9–10.3)
Calcium: 8 mg/dL — ABNORMAL LOW (ref 8.9–10.3)
Chloride: 105 mmol/L (ref 98–111)
Chloride: 101 mmol/L (ref 98–111)
Chloride: 103 mmol/L (ref 98–111)
Chloride: 104 mmol/L (ref 98–111)
Chloride: 101 mmol/L (ref 98–111)
Creatinine, Ser: 1.7 mg/dL — ABNORMAL HIGH (ref 0.61–1.24)
Creatinine, Ser: 1.61 mg/dL — ABNORMAL HIGH (ref 0.61–1.24)
Creatinine, Ser: 1.61 mg/dL — ABNORMAL HIGH (ref 0.61–1.24)
Creatinine, Ser: 1.57 mg/dL — ABNORMAL HIGH (ref 0.61–1.24)
Creatinine, Ser: 1.52 mg/dL — ABNORMAL HIGH (ref 0.61–1.24)
GFR, Estimated: 49 mL/min — ABNORMAL LOW (ref >60)
GFR, Estimated: 52 mL/min — ABNORMAL LOW (ref >60)
GFR, Estimated: 52 mL/min — ABNORMAL LOW (ref >60)
GFR, Estimated: 54 mL/min — ABNORMAL LOW (ref >60)
GFR, Estimated: 56 mL/min — ABNORMAL LOW (ref >60)
Glucose, Bld: 97 mg/dL (ref 70–99)
Glucose, Bld: 229 mg/dL — ABNORMAL HIGH (ref 70–99)
Glucose, Bld: 303 mg/dL — ABNORMAL HIGH (ref 70–99)
Glucose, Bld: 297 mg/dL — ABNORMAL HIGH (ref 70–99)
Glucose, Bld: 230 mg/dL — ABNORMAL HIGH (ref 70–99)
Potassium: 3.2 mmol/L — ABNORMAL LOW (ref 3.5–5.1)
Potassium: 2.7 mmol/L — CL (ref 3.5–5.1)
Potassium: 2.9 mmol/L — ABNORMAL LOW (ref 3.5–5.1)
Potassium: 3.1 mmol/L — ABNORMAL LOW (ref 3.5–5.1)
Potassium: 3.1 mmol/L — ABNORMAL LOW (ref 3.5–5.1)
Sodium: 138 mmol/L (ref 135–145)
Sodium: 134 mmol/L — ABNORMAL LOW (ref 135–145)
Sodium: 133 mmol/L — ABNORMAL LOW (ref 135–145)
Sodium: 130 mmol/L — ABNORMAL LOW (ref 135–145)
Sodium: 132 mmol/L — ABNORMAL LOW (ref 135–145)

## 2021-05-18 LAB — BETA-HYDROXYBUTYRIC ACID
Beta-Hydroxybutyric Acid: 2.99 mmol/L — ABNORMAL HIGH (ref 0.05–0.27)
Beta-Hydroxybutyric Acid: 2.99 mmol/L — ABNORMAL HIGH (ref 0.05–0.27)
Beta-Hydroxybutyric Acid: 2 mmol/L — ABNORMAL HIGH (ref 0.05–0.27)
Beta-Hydroxybutyric Acid: 0.71 mmol/L — ABNORMAL HIGH (ref 0.05–0.27)
Beta-Hydroxybutyric Acid: 0.14 mmol/L (ref 0.05–0.27)

## 2021-05-18 LAB — PHOSPHORUS: Phosphorus: <1 mg/dL — CL (ref 2.5–4.6)

## 2021-05-18 LAB — HEMOGLOBIN A1C
Hgb A1c MFr Bld: >15.5 % — ABNORMAL HIGH (ref 4.8–5.6)
Mean Plasma Glucose: >398 mg/dL

## 2021-05-18 LAB — MAGNESIUM
Magnesium: 2.1 mg/dL (ref 1.7–2.4)
Magnesium: 2.2 mg/dL (ref 1.7–2.4)

## 2021-05-18 MED ORDER — INSULIN DETEMIR 100 UNIT/ML ~~LOC~~ SOLN
21.0000 [IU] | Freq: Every day | SUBCUTANEOUS | Status: DC
  Administered 2021-05-18 – 2021-05-19 (×2): 21 [IU] via SUBCUTANEOUS
  Filled 2021-05-18 (×2): qty 0.21

## 2021-05-18 MED ORDER — INSULIN ASPART 100 UNIT/ML IJ SOLN
0.0000 [IU] | Freq: Three times a day (TID) | INTRAMUSCULAR | Status: DC
  Administered 2021-05-19: 5 [IU] via SUBCUTANEOUS
  Administered 2021-05-19: 11 [IU] via SUBCUTANEOUS
  Administered 2021-05-19: 5 [IU] via SUBCUTANEOUS
  Administered 2021-05-20: 3 [IU] via SUBCUTANEOUS

## 2021-05-18 MED ORDER — INSULIN ASPART 100 UNIT/ML IJ SOLN
0.0000 [IU] | Freq: Every day | INTRAMUSCULAR | Status: DC
  Administered 2021-05-19: 4 [IU] via SUBCUTANEOUS

## 2021-05-18 MED ORDER — LIP MEDEX EX OINT
TOPICAL_OINTMENT | CUTANEOUS | Status: DC | PRN
  Filled 2021-05-18: qty 7

## 2021-05-18 MED ORDER — SODIUM CHLORIDE 0.9 % IV SOLN: INTRAVENOUS | Status: DC

## 2021-05-18 MED ORDER — POTASSIUM PHOSPHATES 15 MMOLE/5ML IV SOLN
30.0000 mmol | Freq: Once | INTRAVENOUS | Status: AC
  Administered 2021-05-18: 30 mmol via INTRAVENOUS
  Filled 2021-05-18: qty 10

## 2021-05-18 MED ORDER — POTASSIUM CHLORIDE CRYS ER 20 MEQ PO TBCR
40.0000 meq | EXTENDED_RELEASE_TABLET | ORAL | Status: AC
  Administered 2021-05-18 (×3): 40 meq via ORAL
  Filled 2021-05-18 (×3): qty 2

## 2021-05-18 MED ORDER — POTASSIUM CHLORIDE CRYS ER 20 MEQ PO TBCR
40.0000 meq | EXTENDED_RELEASE_TABLET | ORAL | Status: AC
  Administered 2021-05-18 – 2021-05-19 (×2): 40 meq via ORAL
  Filled 2021-05-18 (×2): qty 2

## 2021-05-18 MED ORDER — SODIUM CHLORIDE 0.9 % IV BOLUS
1000.0000 mL | Freq: Once | INTRAVENOUS | Status: AC
  Administered 2021-05-18: 1000 mL via INTRAVENOUS

## 2021-05-18 NOTE — Progress Notes (Signed)
°  Progress Note   Patient: Mark Fry TML:[id number] DOB: [birth year] DOA: 05/16/2021     1 DOS: the patient was seen and examined on 05/18/2021   Brief hospital course: 51 year old man PMH insulin-dependent diabetes type 2 presented with generalized weakness, shortness of breath, 1 episode of vomiting and 1 week of constipation.  Markedly hyperglycemic, low pH, treated with IV fluids, IV insulin and admitted for DKA. --1/6 feels a lot better, no pain nausea or vomiting.  Very hungry.  Reports he has insulin at home, good access.  Assessment and Plan * DKA (diabetic ketoacidosis) ([place])- (present on admission) -- Severe on presentation.  Slow to improve, last check noticed early a.m. showed CO2 still low, beta hydroxybutyrate still high. -- Continue IV insulin until beta hydroxybutyrate is cleared and CO2 has normalized.  Serial BMP, beta hydroxybutyrate.  AKI (acute kidney injury) ([place]) -- Baseline creatinine around 1, slowly improving.  Will bolus additional fluids and continue IV fluids.  Check BMP in AM.  Constipation -- No bowel movement for about 1 week.  No vomiting now.  CT no evidence of obstruction.  No clinical signs or symptoms to suggest worrisome process.   --continue bowel regimen  Hyperkalemia -- Resolved, now hypokalemic  Aortic atherosclerosis ([facility]) -- No inpatient treatment indicated     Subjective:  Feels a lot better, no nausea or vomiting, no pain.  Very hungry.  Objective Vital signs were reviewed and unremarkable. Physical Exam Constitutional:      General: He is not in acute distress.    Appearance: He is not ill-appearing or toxic-appearing.  Cardiovascular:     Rate and Rhythm: Normal rate and regular rhythm.     Heart sounds: No murmur heard. Pulmonary:     Effort: Pulmonary effort is normal. No respiratory distress.     Breath sounds: No wheezing, rhonchi or rales.  Neurological:     Mental Status: He is alert.  Psychiatric:        Mood and  Affect: Mood normal.        Behavior: Behavior normal.     Data Reviewed:  CBG 1-2 100s.  Overall better controlled.  Potassium low at 2.7 today, will check magnesium and replace.  Creatinine very slow to improve, down to 1.61 today, BUN slowly coming down to 27.  We will plan additional IV fluids today.  Family Communication: none  Disposition: Status is: Inpatient  Remains inpatient appropriate because: DKA, hypokalemia, acute kidney injury         Time spent: 35 minutes  Author: Murray Hodgkins, MD 05/18/2021 9:34 AM  For on call review [email or web address].

## 2021-05-18 NOTE — Assessment & Plan Note (Addendum)
--   Severe on presentation.  Slow to improve but now resolved with standard treatment.

## 2021-05-18 NOTE — Assessment & Plan Note (Addendum)
Resolved

## 2021-05-18 NOTE — Progress Notes (Signed)
Inpatient Diabetes Program Recommendations  AACE/ADA: New Consensus Statement on Inpatient Glycemic Control (2015)  Target Ranges:  Prepandial:   less than 140 mg/dL      Peak postprandial:   less than 180 mg/dL (1-2 hours)      Critically ill patients:  140 - 180 mg/dL   Lab Results  Component Value Date   GLUCAP 294 (H) 05/18/2021   HGBA1C 14.4 (H) 09/05/2019    Review of Glycemic Control  Diabetes history: DM 2 Outpatient Diabetes medications: Novolog (prescribed Levemir in the past) Current orders for Inpatient glycemic control:  IV insulin/Endotool/DKA  Last PCP visit 08/2019. Pt will need and desires new PCP for follow up  Pt states using insulin from [facility] but reports having Levemir at home but not taking. Has 2 boxes of Levemir left over (expires Oct 2023) Has one regular insulin pen left  Spoke with pt at bedside on 1/5.   At time of transition consider  -   Levemir 20 units Q24 hours -   Novolog 0-15 units tid + hs   Even though pt has insulin left over at home, pt may benefit from 70/30 twice a day for affordability in the future. Placed consult for Benefits check on insulins to inquire about copay.   D/c needs: Insulin pen needles [id number] Either basal bolus insulin or 70/30 bid   Thanks,  Tama Headings RN, MSN, BC-ADM Inpatient Diabetes Coordinator Team Pager [phone] (8a-5p)

## 2021-05-18 NOTE — Progress Notes (Addendum)
Patient ID: Mark Fry, male   DOB: [birth year], 51 y.o.   MRN: [id number] Patient anion gap has closed, beta hydroxybutyric acid within normal limit, she is tolerating oral intake, will start her on home dose Levemir, and insulin sliding scale, and RN requested to DC her insulin  drip 1 hour after giving Levemir. [name] MD

## 2021-05-18 NOTE — Assessment & Plan Note (Addendum)
--   Baseline creatinine around 1, continues to slowly improve.  Cut IV fluids down, check BMP in AM.

## 2021-05-18 NOTE — Assessment & Plan Note (Signed)
--   No inpatient treatment indicated

## 2021-05-18 NOTE — Assessment & Plan Note (Addendum)
--   No bowel movement for about 1 week prior to admission, resolved with enema.  No vomiting now.  CT no evidence of obstruction.   --continue bowel regimen

## 2021-05-18 NOTE — Progress Notes (Signed)
Initial Nutrition Assessment  DOCUMENTATION CODES:   Not applicable  INTERVENTION:  - communicated with DM Coordinator about patient asking about timing of CBG checks.   NUTRITION DIAGNOSIS:   Inadequate oral intake related to acute illness, nausea, vomiting, constipation as evidenced by per patient/family report.  GOAL:   Patient will meet greater than or equal to 90% of their needs  MONITOR:   PO intake, Labs, Weight trends  REASON FOR ASSESSMENT:   Malnutrition Screening Tool  ASSESSMENT:   51 y.o. male with medical history of insulin-dependent type 2 DM. He presented to the ED due to generalized weakness, shortness of breath, N/V, and constipation. In the ED, blood glucose 621 mg/dl and CT abdomen/pelvis moderate stool burden. He reported constipation x1 week that did not improve with over-the-counter laxatives. He was admitted for DKA in 08/2019 and has not seen his PCP since that time.  Patient sitting up in bed with no visitors present at the time of RD visit. Patient reports that he has a good appetite at baseline. He bought hamburger helper and a gallon of milk ~1 week PTA. He drank the gallon of milk but ended up not consuming any of the hamburger helper. It is unusual for him to drink milk.  He reports that since that time, he had not had a BM. He has not had constipation or diarrhea with dairy at any time in the past.   Patient reports he had an enema last night which was effective and that he was very hungry this AM. He ate 100% of breakfast (343 kcal and 22 grams protein) and was looking forward to ordering lunch.   He states that ate home he mainly only drinks sugar-free lemonade and water. He is mindful of food intake/choices.   He owns a scale but does not weigh himself at home.  Weight on 1/4 was documented as 180 lb and appears to be a stated weight. PTA the most recently documented weight was 190 lb on 09/04/19.  Patient asks when he should be checking his  CBGs. Let him know RD would contact DM Coordinator to let them know that he has this question. He denies any nutrition-related questions or concerns at this time.    Labs reviewed; CBGs: 114-313 mg/dl, Na: 133 mmol/l, K: 2.9 mmol/l, BUN: 26 mg/dl, creatinine: 1.61 mg/dl, Ca: 7.6 mg/dl, GFR: 52 ml/min.  Medications reviewed; 17 g miralax BID, 10 mEq IV KCl x4 runs 1/6, 40 mEq Klor-Con x3 doses 1/6, 30 mmol IV KPhos x2 runs 1/6, 1 tablet senokot/day.  IVF; NS @ 125 ml/hr.    NUTRITION - FOCUSED PHYSICAL EXAM:  Completed; no muscle or fat depletions  Diet Order:   Diet Order             Diet Carb Modified Fluid consistency: Thin; Room service appropriate? Yes  Diet effective now                   EDUCATION NEEDS:   No education needs have been identified at this time  Skin:  Skin Assessment: Reviewed RN Assessment  Last BM:  1/5  Height:   Ht Readings from Last 1 Encounters:  05/16/21 6' (1.829 m)    Weight:   Wt Readings from Last 1 Encounters:  05/16/21 81.6 kg     Estimated Nutritional Needs:  Kcal:  2100-2300 kcal Protein:  105-120 grams Fluid:  >/= 2.3 L/day      Jarome Matin, MS, RD, LDN Inpatient Clinical Dietitian  RD pager # available in AMION  After hours/weekend pager # available in [facility]

## 2021-05-18 NOTE — Discharge Instructions (Signed)
-    The goal for your A1c to be at is a 7% all the time (which around 150 all the time when you stick your finger) -  Check your blood sugar level first thing in the morning and alternate a second check (either before lunch, before supper, or at bedtime) to see how high your glucose trends go later in the day.

## 2021-05-18 NOTE — Assessment & Plan Note (Addendum)
repleted ?

## 2021-05-18 NOTE — TOC Initial Note (Signed)
Transition of Care [facility]) - Initial/Assessment Note    Patient Details  Name: Mark Fry MRN: [id number] Date of Birth: Sep 12, 1970  Transition of Care [facility]) CM/SW Contact:    Leeroy Cha, RN Phone Number: 05/18/2021, 7:39 AM  Clinical Narrative:                  Transition of Care [facility]) Screening Note   Patient Details  Name: Mark Fry Date of Birth: 12-02-1970   Transition of Care [facility]) CM/SW Contact:    Leeroy Cha, RN Phone Number: 05/18/2021, 7:39 AM    Transition of Care Department [facility]) has reviewed patient and no TOC needs have been identified at this time. We will continue to monitor patient advancement through interdisciplinary progression rounds. If new patient transition needs arise, please place a TOC consult.    Expected Discharge Plan: Home/Self Care Barriers to Discharge: Continued Medical Work up   Patient Goals and CMS Choice Patient states their goals for this hospitalization and ongoing recovery are:: to go home CMS Medicare.gov Compare Post Acute Care list provided to:: Patient    Expected Discharge Plan and Services Expected Discharge Plan: Home/Self Care   Discharge Planning Services: CM Consult   Living arrangements for the past 2 months: Single Family Home                                      Prior Living Arrangements/Services Living arrangements for the past 2 months: Single Family Home Lives with:: Self Patient language and need for interpreter reviewed:: Yes Do you feel safe going back to the place where you live?: Yes            Criminal Activity/Legal Involvement Pertinent to Current Situation/Hospitalization: No - Comment as needed  Activities of Daily Living Home Assistive Devices/Equipment: None ADL Screening (condition at time of admission) Patient's cognitive ability adequate to safely complete daily activities?: Yes Is the patient deaf or have difficulty hearing?: No Does the patient have  difficulty seeing, even when wearing glasses/contacts?: No Does the patient have difficulty concentrating, remembering, or making decisions?: No Patient able to express need for assistance with ADLs?: Yes Does the patient have difficulty dressing or bathing?: No Independently performs ADLs?: Yes (appropriate for developmental age) Does the patient have difficulty walking or climbing stairs?: No Weakness of Legs: None Weakness of Arms/Hands: None  Permission Sought/Granted                  Emotional Assessment   Attitude/Demeanor/Rapport: Engaged Affect (typically observed): Calm Orientation: : Oriented to Place, Oriented to Self, Oriented to  Time, Oriented to Situation Alcohol / Substance Use: Not Applicable Psych Involvement: No (comment)  Admission diagnosis:  SOB (shortness of breath) [R06.02] DKA (diabetic ketoacidosis) ([place]) [E11.10] AKI (acute kidney injury) ([place]) [N17.9] Diabetic ketoacidosis without coma associated with other specified diabetes mellitus ([place]) [E13.10] Patient Active Problem List   Diagnosis Date Noted   DKA (diabetic ketoacidosis) ([place]) 05/17/2021   Hyperkalemia 05/17/2021   AKI (acute kidney injury) ([place]) 05/17/2021   Dyspnea 05/17/2021   Constipation 05/17/2021   Aortic atherosclerosis ([place]) 05/17/2021   DKA (diabetic ketoacidoses) 09/04/2019   Uncontrolled type 2 diabetes mellitus with hyperglycemia ([place]) 04/06/2019   Type 2 diabetes mellitus with hyperglycemia, without long-term current use of insulin ([place]) 03/30/2019   Hyperglycemia 08/14/2014   Hypertension 08/14/2014   Polycythemia 08/14/2014   CKD (chronic  kidney disease), stage II 08/14/2014   Diabetes mellitus, new onset ([place]) 08/14/2014   PCP:  Libby Maw, MD Pharmacy:   [facility] [place] [address] [place] ([place])  [address] Phone: [phone] Fax: [phone]     Social Determinants of Health (SDOH)  Interventions    Readmission Risk Interventions No flowsheet data found.

## 2021-05-19 DIAGNOSIS — E1310 Other specified diabetes mellitus with ketoacidosis without coma: Secondary | ICD-10-CM | POA: Diagnosis not present

## 2021-05-19 DIAGNOSIS — E1165 Type 2 diabetes mellitus with hyperglycemia: Secondary | ICD-10-CM

## 2021-05-19 DIAGNOSIS — N179 Acute kidney failure, unspecified: Secondary | ICD-10-CM | POA: Diagnosis not present

## 2021-05-19 LAB — BETA-HYDROXYBUTYRIC ACID
Beta-Hydroxybutyric Acid: 0.4 mmol/L — ABNORMAL HIGH (ref 0.05–0.27)
Beta-Hydroxybutyric Acid: 0.3 mmol/L — ABNORMAL HIGH (ref 0.05–0.27)
Beta-Hydroxybutyric Acid: 0.1 mmol/L (ref 0.05–0.27)

## 2021-05-19 LAB — BASIC METABOLIC PANEL
Anion gap: 6 (ref 5–15)
Anion gap: 8 (ref 5–15)
BUN: 24 mg/dL — ABNORMAL HIGH (ref 6–20)
BUN: 21 mg/dL — ABNORMAL HIGH (ref 6–20)
CO2: 24 mmol/L (ref 22–32)
CO2: 24 mmol/L (ref 22–32)
Calcium: 8 mg/dL — ABNORMAL LOW (ref 8.9–10.3)
Calcium: 8.4 mg/dL — ABNORMAL LOW (ref 8.9–10.3)
Chloride: 105 mmol/L (ref 98–111)
Chloride: 105 mmol/L (ref 98–111)
Creatinine, Ser: 1.38 mg/dL — ABNORMAL HIGH (ref 0.61–1.24)
Creatinine, Ser: 1.34 mg/dL — ABNORMAL HIGH (ref 0.61–1.24)
GFR, Estimated: >60 mL/min (ref >60)
GFR, Estimated: >60 mL/min (ref >60)
Glucose, Bld: 200 mg/dL — ABNORMAL HIGH (ref 70–99)
Glucose, Bld: 311 mg/dL — ABNORMAL HIGH (ref 70–99)
Potassium: 3 mmol/L — ABNORMAL LOW (ref 3.5–5.1)
Potassium: 3.7 mmol/L (ref 3.5–5.1)
Sodium: 135 mmol/L (ref 135–145)
Sodium: 137 mmol/L (ref 135–145)

## 2021-05-19 LAB — PHOSPHORUS: Phosphorus: 3.2 mg/dL (ref 2.5–4.6)

## 2021-05-19 LAB — GLUCOSE, CAPILLARY
Glucose-Capillary: 183 mg/dL — ABNORMAL HIGH (ref 70–99)
Glucose-Capillary: 213 mg/dL — ABNORMAL HIGH (ref 70–99)
Glucose-Capillary: 232 mg/dL — ABNORMAL HIGH (ref 70–99)
Glucose-Capillary: 280 mg/dL — ABNORMAL HIGH (ref 70–99)
Glucose-Capillary: 306 mg/dL — ABNORMAL HIGH (ref 70–99)
Glucose-Capillary: 324 mg/dL — ABNORMAL HIGH (ref 70–99)

## 2021-05-19 MED ORDER — INSULIN ASPART 100 UNIT/ML IJ SOLN
3.0000 [IU] | Freq: Once | INTRAMUSCULAR | Status: AC
  Administered 2021-05-19: 3 [IU] via SUBCUTANEOUS

## 2021-05-19 MED ORDER — INSULIN ASPART 100 UNIT/ML IJ SOLN
3.0000 [IU] | Freq: Three times a day (TID) | INTRAMUSCULAR | Status: DC
  Administered 2021-05-19 – 2021-05-20 (×3): 3 [IU] via SUBCUTANEOUS

## 2021-05-19 MED ORDER — POTASSIUM CHLORIDE CRYS ER 20 MEQ PO TBCR
20.0000 meq | EXTENDED_RELEASE_TABLET | Freq: Once | ORAL | Status: AC
Start: 2021-05-19 — End: 2021-05-19
  Administered 2021-05-19: 20 meq via ORAL
  Filled 2021-05-19: qty 1

## 2021-05-19 MED ORDER — POTASSIUM CHLORIDE 10 MEQ/100ML IV SOLN
10.0000 meq | INTRAVENOUS | Status: AC
  Administered 2021-05-19 (×2): 10 meq via INTRAVENOUS
  Filled 2021-05-19 (×2): qty 100

## 2021-05-19 MED ORDER — ORAL CARE MOUTH RINSE
15.0000 mL | Freq: Two times a day (BID) | OROMUCOSAL | Status: DC
  Administered 2021-05-19 – 2021-05-20 (×2): 15 mL via OROMUCOSAL

## 2021-05-19 NOTE — Progress Notes (Addendum)
°  Progress Note   Patient: Mark Fry FWY:[id number] DOB: 06-14-1970 DOA: 05/16/2021     2 DOS: the patient was seen and examined on 05/19/2021   Brief hospital course: 51 year old man PMH insulin-dependent diabetes type 2 presented with generalized weakness, shortness of breath, 1 episode of vomiting and 1 week of constipation.  Markedly hyperglycemic, low pH, treated with IV fluids, IV insulin and admitted for DKA.  DKA was slow to correct, eventually transitioned off insulin infusion without event evening 1/6. -- 1/7 continues to feel better, remains hyperglycemic, continue to adjust insulin, likely home 1/8.  Assessment and Plan * DKA (diabetic ketoacidosis) (HCC)-resolved as of 05/19/2021, (present on admission) -- Severe on presentation.  Slow to improve but now resolved with standard treatment.  Uncontrolled type 2 diabetes mellitus with hyperglycemia ([place])- (present on admission) -- Hemoglobin A1c greater than 15.5 on admission -- Started on subcutaneous insulin 1/6 PM, remains hyperglycemic with fasting over 200/day.  Will monitor today, start meal coverage, continue sliding scale insulin and long-acting insulin.  Hopefully over the next 48 hours if blood sugar stabilizes.  AKI (acute kidney injury) ([place]) -- Baseline creatinine around 1, continues to slowly improve.  Cut IV fluids down, check BMP in AM.  Constipation -- No bowel movement for about 1 week prior to admission, resolved with enema.  No vomiting now.  CT no evidence of obstruction.   --continue bowel regimen  Aortic atherosclerosis ([place]) -- No inpatient treatment indicated  Hypokalemia-resolved as of 05/19/2021 --repleted   Hyperkalemia-resolved as of 05/18/2021 -- Resolved   Doing well, can transfer to medical floor, stop telemetry.  Cut IV fluids down, check BMP in AM.  Had meal coverage, continue other insulin therapy.  Likely home 1 to 2 days.   Subjective:  Feels better No pain No n/v Tolerating diet Was  transitioned off insulin drip last night.  Objective Vital signs were reviewed and unremarkable. Physical Exam Vitals reviewed.  Constitutional:      General: He is not in acute distress.    Appearance: He is not ill-appearing or toxic-appearing.  Cardiovascular:     Rate and Rhythm: Normal rate and regular rhythm.     Heart sounds: No murmur heard. Pulmonary:     Effort: Pulmonary effort is normal. No respiratory distress.     Breath sounds: No rhonchi or rales.  Neurological:     Mental Status: He is alert.  Psychiatric:        Mood and Affect: Mood normal.        Behavior: Behavior normal.     Data Reviewed:  CBG 280 this AM fasting Potassium 3.7, CO2 24, creatinine down to 1.34, BUN down to 21, phosphorus within normal limits, beta hydroxybutyrate within normal limits.  Family Communication: none  Disposition: Status is: Inpatient  Remains inpatient appropriate because: hyperglycemia, AKI         Time spent: 25 minutes  Author: Murray Hodgkins, MD 05/19/2021 10:00 AM  For on call review [email or web address].

## 2021-05-19 NOTE — Assessment & Plan Note (Signed)
--   Hemoglobin A1c greater than 15.5 on admission -- Started on subcutaneous insulin 1/6 PM, remains hyperglycemic with fasting over 200/day.  Will monitor today, start meal coverage, continue sliding scale insulin and long-acting insulin.  Hopefully over the next 48 hours if blood sugar stabilizes.

## 2021-05-19 NOTE — Plan of Care (Signed)
Patient seems to have a very poor understanding of how to manage his diabetes and states he had no idea that carbs affected his blood sugar. This nurse spent hours in discussion with him as outlined below and answered all his questions. He is particularly interested in getting set up with a new GP prior to discharge and has expressed interest in seeing a nurse practitioner.  Further education is necessary and on-going. Patient has been very receptive and eager to learn.   Problem: Education: Goal: Ability to describe self-care measures that may prevent or decrease complications (Diabetes Survival Skills Education) will improve Outcome: Progressing Note: Discussed the importance of taking all prescribed medications, keeping follow-up appointments with primary care, and having honest discussions with his provider. Patient seems eager to get his health back on track and has engaged in honest discourse at length with appropriate questions. This nurse also stressed to pt that his health care providers are not here to judge him, but to help him, so he should be honest with his provider if he is not taking his medications as well. This nurse explained that keeping follow-up appointments keeps communication flowing between him and his provider and that will help to enhance that relationship and allow [place] to help him meet his goals.    Problem: Coping: Goal: Ability to adjust to condition or change in health will improve Outcome: Progressing Note: Engaged in frank discourse with pt about how the risks of not managing his diabetes outweighs the difficulty of changing his lifestyle. Pt knows he has to make changes, but is still wrestling with how these changes will affect his life. He is determined to make a change, and shared that it gets exhausting having to constantly think about his health. This nurse encouraged patient that while it is something he will constantly have to think about and manage, it will get  easier over time as it becomes routine. This nurse also noted to pt that not managing his condition doesn't make it go away and can cause serious complications for him in the future. Pt states he is a disciplined person and feels that once he gets used to carb counting and medication management, he will be fine.    Problem: Health Behavior/Discharge Planning: Goal: Ability to identify and utilize available resources and services will improve Outcome: Progressing Note: Pt requests a new primary care and is particularly interested in seeing a nurse practitioner. Assured patient we can get this set up before he is discharged. This nurse also recommended resources such as AHA.org for diabetic-friendly recipes.  Goal: Ability to manage health-related needs will improve Outcome: Progressing Note: Pt acknowledges failure to manage his condition since diagnosis. He feels that he and his GP did not see eye-to-eye and he eventually stopped keeping appointments. This nurse has taken steps to set him up with a new GP prior to discharge and encouraged him to be open and honest with his doctor. This nurse went over in detail the role of carbs, how to carb count, how to recognize hyperglycemia/hypoglycemia, the pathophysiology of weight loss with respect to hyperglycemia and how his kidneys and heart are affected by diabetes.    Problem: Metabolic: Goal: Ability to maintain appropriate glucose levels will improve Outcome: Progressing Note: Discussed the importance of keeping stable levels throughout the day with proper carb management. Patient said he was not taking his long-acting insulin prior to admission. This nurse explained the purpose of the long-acting vs meal coverage insulin and the importance of  utilizing both to keep from having peaks and valleys in his glucose levels. Pt was very receptive and acknowledges understanding.   Problem: Nutritional: Goal: Maintenance of adequate nutrition will  improve Outcome: Progressing Note: Pt had questions about why his glucose rises after drinking sugar free juice. This nurse explained how carbs affect his glucose levels and discussed the importance of carefully reading the labels of food and drinks.    Problem: Nutritional: Goal: Progress toward achieving an optimal weight will improve Outcome: Not Progressing Note: Pt states that he would like to gain weight and that he has lost quite a bit of weight in the last year. This nurse explained why he is losing weight and encouraged him to get his glucose levels stable through diet and medication adherence. Also encouraged him to discuss this goal of weight gain with his doctor so that they can assist him in doing so safely. This nurse stressed that this goal cannot be achieved without proper management of his diabetes. Pt states his understanding.

## 2021-05-20 DIAGNOSIS — E1310 Other specified diabetes mellitus with ketoacidosis without coma: Secondary | ICD-10-CM | POA: Diagnosis not present

## 2021-05-20 DIAGNOSIS — E1165 Type 2 diabetes mellitus with hyperglycemia: Secondary | ICD-10-CM | POA: Diagnosis not present

## 2021-05-20 DIAGNOSIS — N179 Acute kidney failure, unspecified: Secondary | ICD-10-CM | POA: Diagnosis not present

## 2021-05-20 LAB — GLUCOSE, CAPILLARY
Glucose-Capillary: 169 mg/dL — ABNORMAL HIGH (ref 70–99)
Glucose-Capillary: 199 mg/dL — ABNORMAL HIGH (ref 70–99)

## 2021-05-20 MED ORDER — "PEN NEEDLES 3/16"" 31G X 5 MM MISC": 3 refills | Status: DC

## 2021-05-20 MED ORDER — INSULIN DETEMIR 100 UNIT/ML FLEXPEN: 22.0000 [IU] | PEN_INJECTOR | Freq: Every day | SUBCUTANEOUS | 1 refills | Status: DC

## 2021-05-20 MED ORDER — POLYETHYLENE GLYCOL 3350 17 G PO PACK: 17.0000 g | PACK | Freq: Every day | ORAL | 0 refills | Status: DC | PRN

## 2021-05-20 MED ORDER — INSULIN ASPART 100 UNIT/ML FLEXPEN: 3.0000 [IU] | PEN_INJECTOR | Freq: Three times a day (TID) | SUBCUTANEOUS | 2 refills | Status: DC

## 2021-05-20 NOTE — Discharge Summary (Signed)
Physician Discharge Summary   Patient: Mark Fry MRN: [id number] DOB: 1970/08/02  Admit date:     05/16/2021  Discharge date: 05/20/21  Discharge Physician: Murray Hodgkins   PCP: Libby Maw, MD   Recommendations at discharge:   Patient plans to establish with new PCP, close outpatient follow-up for uncontrolled diabetes mellitus type 2 with hyperglycemia, status post DKA Ongoing diet education Follow renal function with recent AKI  Discharge Diagnoses Active Problems:   Uncontrolled type 2 diabetes mellitus with hyperglycemia ([place])   AKI (acute kidney injury) ([place])   Constipation   Aortic atherosclerosis ([place])  Principal Problem (Resolved):   DKA (diabetic ketoacidosis) ([place]) Resolved Problems:   Hyperkalemia   Hypokalemia   Hospital Course   51 year old man PMH insulin-dependent diabetes type 2 presented with generalized weakness, shortness of breath, 1 episode of vomiting and 1 week of constipation.  Markedly hyperglycemic, low pH, treated with IV fluids, IV insulin and admitted for DKA.  DKA was slow to correct, eventually transitioned off insulin infusion without event evening 1/6.  Condition slowly improved, blood sugar stable, tolerating diet, discharged home.  Long discussion with patient on day of discharge, reviewed insulin dosing, he has Levemir at home, new prescriptions for NovoLog and pen needles.  He has testing strips and glucometer.  He will establish with a new PCP based on his insurance.  We discussed diet.  * DKA (diabetic ketoacidosis) (HCC)-resolved as of 05/19/2021, (present on admission) -- Severe on presentation.  Slow to improve but now resolved with standard treatment.  Uncontrolled type 2 diabetes mellitus with hyperglycemia ([place])- (present on admission) -- Hemoglobin A1c greater than 15.5 on admission -- As above, close outpatient follow-up, continue Levemir and meal coverage.  AKI (acute kidney injury) ([place]) -- Baseline creatinine  around 1, slowly improved.  Expect spontaneous recovery.  Constipation -- No bowel movement for about 1 week prior to admission, resolved with enema.  No vomiting now.  CT no evidence of obstruction.   -- MiraLAX as needed as an outpatient  Aortic atherosclerosis ([place]) -- No inpatient treatment indicated  Hypokalemia-resolved as of 05/19/2021 --repleted   Hyperkalemia-resolved as of 05/18/2021 -- Resolved       Consultants: none Procedures performed: none  Disposition: Home Diet recommendation: Carb modified diet  DISCHARGE MEDICATION: Allergies as of 05/20/2021   No Known Allergies      Medication List     TAKE these medications    diclofenac 50 MG EC tablet Commonly known as: VOLTAREN Take 1 tablet (50 mg total) by mouth 2 (two) times daily. With food   insulin aspart 100 UNIT/ML FlexPen Commonly known as: NOVOLOG Inject 3 Units into the skin 3 (three) times daily with meals. What changed: how much to take   insulin detemir 100 UNIT/ML FlexPen Commonly known as: LEVEMIR Inject 22 Units into the skin at bedtime. What changed: how much to take   Pen Needles 3/16" 31G X 5 MM Misc Take Levemir at bedtime, NovoLog at mealtimes,  as directed What changed: additional instructions   polyethylene glycol 17 g packet Commonly known as: MIRALAX / GLYCOLAX Take 17 g by mouth daily as needed for mild constipation.        Follow-up Information     New physician Follow up.   Why: Call [employer] Monday to get a list of physicians in your area and then establish an appointment as soon as possible.  Feels good Tolerating diet  Discharge Exam: Filed Weights   05/16/21 2222  Weight: 81.6 kg   Physical Exam Vitals reviewed.  Constitutional:      General: He is not in acute distress.    Appearance: He is not ill-appearing or toxic-appearing.  Cardiovascular:     Rate and Rhythm: Normal rate and regular rhythm.     Heart sounds: No murmur  heard. Pulmonary:     Effort: Pulmonary effort is normal. No respiratory distress.     Breath sounds: No wheezing, rhonchi or rales.  Neurological:     Mental Status: He is alert.  Psychiatric:        Mood and Affect: Mood normal.        Behavior: Behavior normal.     Condition at discharge: good  The results of significant diagnostics from this hospitalization (including imaging, microbiology, ancillary and laboratory) are listed below for reference.   Imaging Studies: CT Abdomen Pelvis Wo Contrast  Result Date: 05/17/2021 CLINICAL DATA:  Bowel obstruction EXAM: CT ABDOMEN AND PELVIS WITHOUT CONTRAST TECHNIQUE: Multidetector CT imaging of the abdomen and pelvis was performed following the standard protocol without IV contrast. COMPARISON:  05/12/2015 FINDINGS: Lower chest: No acute abnormality. Hepatobiliary: No focal liver abnormality is seen. No gallstones, gallbladder wall thickening, or biliary dilatation. Pancreas: Unremarkable Spleen: Unremarkable Adrenals/Urinary Tract: Adrenal glands are unremarkable. Kidneys are normal in size and position. Multiple simple cortical cyst again noted within the upper pole the right kidney. The kidneys are otherwise unremarkable. Bladder unremarkable. Stomach/Bowel: Moderate stool seen throughout the colon without evidence of obstruction. Stomach, small bowel, and large bowel are otherwise unremarkable. Appendix normal. No free intraperitoneal gas or fluid. Vascular/Lymphatic: Minimal aortoiliac atherosclerotic calcification. Abdominal vasculature is otherwise unremarkable on this noncontrast examination. No pathologic adenopathy within the abdomen and pelvis. Reproductive: Prostate is unremarkable. Other: No abdominal wall hernia. Musculoskeletal: No acute or significant osseous findings. IMPRESSION: No acute intra-abdominal pathology identified. No definite radiographic explanation for the patient's symptoms. Moderate stool burden Aortic Atherosclerosis  (ICD10-I70.0). Electronically Signed   By: Fidela Salisbury M.D.   On: 05/17/2021 01:59   DG Chest 1 View  Result Date: 05/16/2021 CLINICAL DATA:  Shortness of breath. EXAM: CHEST  1 VIEW COMPARISON:  Chest x-ray 03/17/2019. FINDINGS: The heart size and mediastinal contours are within normal limits. Both lungs are clear. The visualized skeletal structures are unremarkable. IMPRESSION: No active disease. Electronically Signed   By: Ronney Asters M.D.   On: 05/16/2021 23:32    Microbiology: Results for orders placed or performed during the hospital encounter of 05/16/21  Resp Panel by RT-PCR (Flu A&B, Covid) Nasopharyngeal Swab     Status: None   Collection Time: 05/16/21 11:13 PM   Specimen: Nasopharyngeal Swab; Nasopharyngeal(NP) swabs in vial transport medium  Result Value Ref Range Status   SARS Coronavirus 2 by RT PCR NEGATIVE NEGATIVE Final    Comment: (NOTE) SARS-CoV-2 target nucleic acids are NOT DETECTED.  The SARS-CoV-2 RNA is generally detectable in upper respiratory specimens during the acute phase of infection. The lowest concentration of SARS-CoV-2 viral copies this assay can detect is 138 copies/mL. A negative result does not preclude SARS-Cov-2 infection and should not be used as the sole basis for treatment or other patient management decisions. A negative result may occur with  improper specimen collection/handling, submission of specimen other than nasopharyngeal swab, presence of viral mutation(s) within the areas targeted by this assay, and inadequate number of viral copies(<138 copies/mL). A negative result  must be combined with clinical observations, patient history, and epidemiological information. The expected result is Negative.  Fact Sheet for Patients:  EntrepreneurPulse.com.au  Fact Sheet for Healthcare Providers:  IncredibleEmployment.be  This test is no t yet approved or cleared by the [place] FDA and  has been  authorized for detection and/or diagnosis of SARS-CoV-2 by FDA under an Emergency Use Authorization (EUA). This EUA will remain  in effect (meaning this test can be used) for the duration of the COVID-19 declaration under Section 564(b)(1) of the Act, 21 U.S.C.section 360bbb-3(b)(1), unless the authorization is terminated  or revoked sooner.       Influenza A by PCR NEGATIVE NEGATIVE Final   Influenza B by PCR NEGATIVE NEGATIVE Final    Comment: (NOTE) The Xpert Xpress SARS-CoV-2/FLU/RSV plus assay is intended as an aid in the diagnosis of influenza from Nasopharyngeal swab specimens and should not be used as a sole basis for treatment. Nasal washings and aspirates are unacceptable for Xpert Xpress SARS-CoV-2/FLU/RSV testing.  Fact Sheet for Patients: EntrepreneurPulse.com.au  Fact Sheet for Healthcare Providers: IncredibleEmployment.be  This test is not yet approved or cleared by the [place] FDA and has been authorized for detection and/or diagnosis of SARS-CoV-2 by FDA under an Emergency Use Authorization (EUA). This EUA will remain in effect (meaning this test can be used) for the duration of the COVID-19 declaration under Section 564(b)(1) of the Act, 21 U.S.C. section 360bbb-3(b)(1), unless the authorization is terminated or revoked.  Performed at [facility], [address], [place], [place] [address]   MRSA Next Gen by PCR, Nasal     Status: None   Collection Time: 05/17/21  3:57 PM   Specimen: Nasal Mucosa; Nasal Swab  Result Value Ref Range Status   MRSA by PCR Next Gen NOT DETECTED NOT DETECTED Final    Comment: (NOTE) The GeneXpert MRSA Assay (FDA approved for NASAL specimens only), is one component of a comprehensive MRSA colonization surveillance program. It is not intended to diagnose MRSA infection nor to guide or monitor treatment for MRSA infections. Test performance is not FDA approved in patients less than  54 years old. Performed at [facility], [address] [place], [place] [address]     Labs: CBC: Recent Labs  Lab 05/16/21 2233  WBC 8.1  HGB 15.7  HCT 48.8  MCV 86.7  PLT 098   Basic Metabolic Panel: Recent Labs  Lab 05/18/21 0306 05/18/21 0841 05/18/21 1233 05/18/21 1646 05/18/21 2043 05/19/21 0036 05/19/21 0531  NA 138 134* 133* 130* 132* 135 137  K 3.2* 2.7* 2.9* 3.1* 3.1* 3.0* 3.7  CL 105 101 103 104 101 105 105  CO2 21* 22 22 22 23 24 24   GLUCOSE 97 229* 303* 297* 230* 200* 311*  BUN 29* 27* 26* 28* 26* 24* 21*  CREATININE 1.70* 1.61* 1.61* 1.57* 1.52* 1.38* 1.34*  CALCIUM 8.1* 7.9* 7.6* 7.5* 8.0* 8.0* 8.4*  MG 2.1 2.2  --   --   --   --   --   PHOS  --  <1.0*  --   --   --   --  3.2   Liver Function Tests: Recent Labs  Lab 05/17/21 0800  AST 24  ALT 27  ALKPHOS 80  BILITOT 2.6*  PROT 7.1  ALBUMIN 3.7   CBG: Recent Labs  Lab 05/19/21 1145 05/19/21 1549 05/19/21 2124 05/20/21 0010 05/20/21 0735  GLUCAP 232* 306* 324* 199* 169*  Discharge time spent: greater than 30 minutes.  Signed: Murray Hodgkins, MD [facility] 05/20/2021

## 2021-05-20 NOTE — Progress Notes (Signed)
Patient discharged. PIV's removed. AVS explained to patient and he stated he had no further questions. A doctors note for work was provided to the patient and his belongings were returned. He was wheeled down to the front.

## 2021-05-22 ENCOUNTER — Telehealth: Payer: Self-pay

## 2021-05-22 NOTE — Telephone Encounter (Signed)
Transition Care Management Unsuccessful Follow-up Telephone Call  Date of discharge and from where: [facility] 05/20/2021  Attempts:  1st Attempt  Reason for unsuccessful TCM follow-up call:  No answer/busy

## 2021-05-23 NOTE — Telephone Encounter (Signed)
Transition Care Management Unsuccessful Follow-up Telephone Call  Date of discharge and from where:  [facility] 05/20/2021  Attempts:  2nd Attempt  Reason for unsuccessful TCM follow-up call:  No answer/busy

## 2021-07-24 DIAGNOSIS — R634 Abnormal weight loss: Secondary | ICD-10-CM | POA: Diagnosis not present

## 2021-07-24 DIAGNOSIS — Z125 Encounter for screening for malignant neoplasm of prostate: Secondary | ICD-10-CM | POA: Diagnosis not present

## 2021-07-24 DIAGNOSIS — Z91118 Patient's noncompliance with dietary regimen for other reason: Secondary | ICD-10-CM | POA: Diagnosis not present

## 2021-07-24 DIAGNOSIS — E1169 Type 2 diabetes mellitus with other specified complication: Secondary | ICD-10-CM | POA: Diagnosis not present

## 2021-08-07 DIAGNOSIS — E1169 Type 2 diabetes mellitus with other specified complication: Secondary | ICD-10-CM | POA: Diagnosis not present

## 2021-08-07 DIAGNOSIS — R000 Tachycardia, unspecified: Secondary | ICD-10-CM | POA: Diagnosis not present

## 2021-08-07 DIAGNOSIS — Z91118 Patient's noncompliance with dietary regimen for other reason: Secondary | ICD-10-CM | POA: Diagnosis not present

## 2021-09-04 DIAGNOSIS — R000 Tachycardia, unspecified: Secondary | ICD-10-CM | POA: Diagnosis not present

## 2021-09-04 DIAGNOSIS — E1169 Type 2 diabetes mellitus with other specified complication: Secondary | ICD-10-CM | POA: Diagnosis not present

## 2021-09-04 DIAGNOSIS — E0842 Diabetes mellitus due to underlying condition with diabetic polyneuropathy: Secondary | ICD-10-CM | POA: Diagnosis not present

## 2021-09-11 ENCOUNTER — Ambulatory Visit: Payer: Federal, State, Local not specified - PPO | Admitting: Podiatry

## 2021-09-20 ENCOUNTER — Ambulatory Visit: Payer: Federal, State, Local not specified - PPO | Admitting: Internal Medicine

## 2022-01-18 ENCOUNTER — Encounter (HOSPITAL_COMMUNITY): Payer: Self-pay

## 2022-01-18 ENCOUNTER — Telehealth (HOSPITAL_COMMUNITY): Payer: Self-pay | Admitting: Emergency Medicine

## 2022-01-18 ENCOUNTER — Emergency Department (HOSPITAL_COMMUNITY)
Admission: EM | Admit: 2022-01-18 | Discharge: 2022-01-18 | Disposition: A | Discharge status: Home / Self Care | Payer: Federal, State, Local not specified - PPO | Attending: Emergency Medicine | Admitting: Emergency Medicine

## 2022-01-18 DIAGNOSIS — I129 Hypertensive chronic kidney disease with stage 1 through stage 4 chronic kidney disease, or unspecified chronic kidney disease: Secondary | ICD-10-CM | POA: Insufficient documentation

## 2022-01-18 DIAGNOSIS — R000 Tachycardia, unspecified: Secondary | ICD-10-CM | POA: Insufficient documentation

## 2022-01-18 DIAGNOSIS — E1165 Type 2 diabetes mellitus with hyperglycemia: Secondary | ICD-10-CM | POA: Diagnosis not present

## 2022-01-18 DIAGNOSIS — Z794 Long term (current) use of insulin: Secondary | ICD-10-CM | POA: Diagnosis not present

## 2022-01-18 DIAGNOSIS — R739 Hyperglycemia, unspecified: Secondary | ICD-10-CM

## 2022-01-18 DIAGNOSIS — E1122 Type 2 diabetes mellitus with diabetic chronic kidney disease: Secondary | ICD-10-CM | POA: Insufficient documentation

## 2022-01-18 DIAGNOSIS — D72819 Decreased white blood cell count, unspecified: Secondary | ICD-10-CM | POA: Diagnosis not present

## 2022-01-18 DIAGNOSIS — N182 Chronic kidney disease, stage 2 (mild): Secondary | ICD-10-CM | POA: Insufficient documentation

## 2022-01-18 LAB — CBC WITH DIFFERENTIAL/PLATELET
Abs Immature Granulocytes: 0 10*3/uL (ref 0.00–0.07)
Basophils Absolute: 0 10*3/uL (ref 0.0–0.1)
Basophils Relative: 1 %
Eosinophils Absolute: 0 10*3/uL (ref 0.0–0.5)
Eosinophils Relative: 1 %
HCT: 45.4 % (ref 39.0–52.0)
Hemoglobin: 15.3 g/dL (ref 13.0–17.0)
Immature Granulocytes: 0 %
Lymphocytes Relative: 28 %
Lymphs Abs: 1 10*3/uL (ref 0.7–4.0)
MCH: 26.9 pg (ref 26.0–34.0)
MCHC: 33.7 g/dL (ref 30.0–36.0)
MCV: 79.9 fL — ABNORMAL LOW (ref 80.0–100.0)
Monocytes Absolute: 0.2 10*3/uL (ref 0.1–1.0)
Monocytes Relative: 4 %
Neutro Abs: 2.5 10*3/uL (ref 1.7–7.7)
Neutrophils Relative %: 66 %
Platelets: 225 10*3/uL (ref 150–400)
RBC: 5.68 MIL/uL (ref 4.22–5.81)
RDW: 12.2 % (ref 11.5–15.5)
WBC: 3.7 10*3/uL — ABNORMAL LOW (ref 4.0–10.5)
nRBC: 0 % (ref 0.0–0.2)

## 2022-01-18 LAB — COMPREHENSIVE METABOLIC PANEL
ALT: 40 U/L (ref 0–44)
AST: 23 U/L (ref 15–41)
Albumin: 4.3 g/dL (ref 3.5–5.0)
Alkaline Phosphatase: 121 U/L (ref 38–126)
Anion gap: 14 (ref 5–15)
BUN: 14 mg/dL (ref 6–20)
CO2: 25 mmol/L (ref 22–32)
Calcium: 9.4 mg/dL (ref 8.9–10.3)
Chloride: 96 mmol/L — ABNORMAL LOW (ref 98–111)
Creatinine, Ser: 0.97 mg/dL (ref 0.61–1.24)
GFR, Estimated: >60 mL/min (ref >60)
Glucose, Bld: 555 mg/dL (ref 70–99)
Potassium: 4 mmol/L (ref 3.5–5.1)
Sodium: 135 mmol/L (ref 135–145)
Total Bilirubin: 0.8 mg/dL (ref 0.3–1.2)
Total Protein: 7.7 g/dL (ref 6.5–8.1)

## 2022-01-18 LAB — BLOOD GAS, VENOUS
Acid-Base Excess: 0.9 mmol/L (ref 0.0–2.0)
Bicarbonate: 25.3 mmol/L (ref 20.0–28.0)
O2 Saturation: 88.2 %
Patient temperature: 37
pCO2, Ven: 39 mmHg — ABNORMAL LOW (ref 44–60)
pH, Ven: 7.42 (ref 7.25–7.43)
pO2, Ven: 53 mmHg — ABNORMAL HIGH (ref 32–45)

## 2022-01-18 LAB — CBG MONITORING, ED
Glucose-Capillary: 549 mg/dL (ref 70–99)
Glucose-Capillary: 419 mg/dL — ABNORMAL HIGH (ref 70–99)

## 2022-01-18 LAB — URINALYSIS, ROUTINE W REFLEX MICROSCOPIC
Bacteria, UA: NONE SEEN
Bilirubin Urine: NEGATIVE
Glucose, UA: >=500 mg/dL — AB
Hgb urine dipstick: NEGATIVE
Ketones, ur: 20 mg/dL — AB
Leukocytes,Ua: NEGATIVE
Nitrite: NEGATIVE
Protein, ur: NEGATIVE mg/dL
Specific Gravity, Urine: 1.039 — ABNORMAL HIGH (ref 1.005–1.030)
pH: 5 (ref 5.0–8.0)

## 2022-01-18 LAB — BETA-HYDROXYBUTYRIC ACID: Beta-Hydroxybutyric Acid: 2.97 mmol/L — ABNORMAL HIGH (ref 0.05–0.27)

## 2022-01-18 LAB — LIPASE, BLOOD: Lipase: 39 U/L (ref 11–51)

## 2022-01-18 MED ORDER — INSULIN ASPART 100 UNIT/ML IJ SOLN
10.0000 [IU] | Freq: Once | INTRAMUSCULAR | Status: AC
  Administered 2022-01-18: 10 [IU] via SUBCUTANEOUS
  Filled 2022-01-18: qty 0.1

## 2022-01-18 MED ORDER — LACTATED RINGERS IV BOLUS
1000.0000 mL | Freq: Once | INTRAVENOUS | Status: AC
  Administered 2022-01-18: 1000 mL via INTRAVENOUS

## 2022-01-18 MED ORDER — INSULIN DETEMIR 100 UNIT/ML FLEXPEN: 22.0000 [IU] | PEN_INJECTOR | Freq: Every day | SUBCUTANEOUS | 1 refills | Status: DC

## 2022-01-18 NOTE — Telephone Encounter (Signed)
Patient requesting repeat prescription sent to pharmacy.

## 2022-01-18 NOTE — ED Provider Notes (Signed)
[place] DEPT Provider Note   CSN: [id number] Arrival date & time: 01/18/22  9326     History PMH: HTN, CKD state 2, DM2 Chief Complaint  Patient presents with   Hyperglycemia    Mark Fry is a 51 y.o. male.  Patient presents the ED with concern for hyperglycemia.  He says about 5 days ago he ran out of his Levemir but he is still been using his before meals NovoLog.  He says his blood sugars was in the 500s at home.  He is concerned because he started develop blurry vision this morning.  His blood sugar normally is in the mid 100s.  He denies any symptoms of chest pain, shortness of breath, abdominal pain, nausea, vomiting, diarrhea, fever, chills, dysuria, hematuria, flank pain.   Hyperglycemia      Home Medications Prior to Admission medications   Medication Sig Start Date End Date Taking? Authorizing Provider  diclofenac (VOLTAREN) 50 MG EC tablet Take 1 tablet (50 mg total) by mouth 2 (two) times daily. With food 07/15/20   Wieters, Hallie C, PA-C  insulin aspart (NOVOLOG) 100 UNIT/ML FlexPen Inject 3 Units into the skin 3 (three) times daily with meals. 05/20/21   Samuella Cota, MD  insulin detemir (LEVEMIR) 100 UNIT/ML FlexPen Inject 22 Units into the skin at bedtime. 01/18/22 06/03/22  Candy Leverett, Adora Fridge, PA-C  Insulin Pen Needle (PEN NEEDLES 3/16") 31G X 5 MM MISC Take Levemir at bedtime, NovoLog at mealtimes,  as directed 05/20/21   Samuella Cota, MD  polyethylene glycol (MIRALAX / GLYCOLAX) 17 g packet Take 17 g by mouth daily as needed for mild constipation. 05/20/21   Samuella Cota, MD      Allergies    Patient has no known allergies.    Review of Systems   Review of Systems  Eyes:  Positive for visual disturbance.  All other systems reviewed and are negative.   Physical Exam Updated Vital Signs BP (!) 131/95   Pulse 99   Temp 97.7 F (36.5 C) (Oral)   Resp 16   Ht 6' (1.829 m)   Wt 65.8 kg   SpO2 100%   BMI  19.67 kg/m  Physical Exam Vitals and nursing note reviewed.  Constitutional:      General: He is not in acute distress.    Appearance: Normal appearance. He is well-developed. He is not ill-appearing, toxic-appearing or diaphoretic.  HENT:     Head: Normocephalic and atraumatic.     Nose: No nasal deformity.     Mouth/Throat:     Lips: Pink. No lesions.     Mouth: Mucous membranes are moist.     Pharynx: Oropharynx is clear. No oropharyngeal exudate or posterior oropharyngeal erythema.  Eyes:     General: Gaze aligned appropriately. No scleral icterus.       Right eye: No discharge.        Left eye: No discharge.     Conjunctiva/sclera: Conjunctivae normal.     Right eye: Right conjunctiva is not injected. No exudate or hemorrhage.    Left eye: Left conjunctiva is not injected. No exudate or hemorrhage. Cardiovascular:     Rate and Rhythm: Regular rhythm. Tachycardia present.     Pulses: Normal pulses.     Heart sounds: Normal heart sounds. No murmur heard.    No friction rub. No gallop.  Pulmonary:     Effort: Pulmonary effort is normal. No respiratory distress.  Breath sounds: Normal breath sounds. No stridor. No wheezing, rhonchi or rales.  Abdominal:     General: Abdomen is flat. There is no distension.     Palpations: Abdomen is soft. There is no mass.     Tenderness: There is no abdominal tenderness. There is no right CVA tenderness, left CVA tenderness, guarding or rebound.     Hernia: No hernia is present.  Musculoskeletal:     Right lower leg: No edema.     Left lower leg: No edema.  Skin:    General: Skin is warm and dry.  Neurological:     General: No focal deficit present.     Mental Status: He is alert and oriented to person, place, and time.  Psychiatric:        Mood and Affect: Mood normal.        Speech: Speech normal.        Behavior: Behavior normal. Behavior is cooperative.     ED Results / Procedures / Treatments   Labs (all labs ordered are  listed, but only abnormal results are displayed) Labs Reviewed  URINALYSIS, ROUTINE W REFLEX MICROSCOPIC - Abnormal; Notable for the following components:      Result Value   Color, Urine STRAW (*)    Specific Gravity, Urine 1.039 (*)    Glucose, UA >=500 (*)    Ketones, ur 20 (*)    All other components within normal limits  BETA-HYDROXYBUTYRIC ACID - Abnormal; Notable for the following components:   Beta-Hydroxybutyric Acid 2.97 (*)    All other components within normal limits  BLOOD GAS, VENOUS - Abnormal; Notable for the following components:   pCO2, Ven 39 (*)    pO2, Ven 53 (*)    All other components within normal limits  CBC WITH DIFFERENTIAL/PLATELET - Abnormal; Notable for the following components:   WBC 3.7 (*)    MCV 79.9 (*)    All other components within normal limits  COMPREHENSIVE METABOLIC PANEL - Abnormal; Notable for the following components:   Chloride 96 (*)    Glucose, Bld 555 (*)    All other components within normal limits  CBG MONITORING, ED - Abnormal; Notable for the following components:   Glucose-Capillary 549 (*)    All other components within normal limits  CBG MONITORING, ED - Abnormal; Notable for the following components:   Glucose-Capillary 419 (*)    All other components within normal limits  LIPASE, BLOOD  CBG MONITORING, ED  CBG MONITORING, ED    EKG None  Radiology No results found.  Procedures Procedures  This patient was on telemetry or cardiac monitoring during their time in the ED.    Medications Ordered in ED Medications  lactated ringers bolus 1,000 mL (0 mLs Intravenous Stopped 01/18/22 0915)  insulin aspart (novoLOG) injection 10 Units (10 Units Subcutaneous Given 01/18/22 0932)  lactated ringers bolus 1,000 mL (0 mLs Intravenous Stopped 01/18/22 1027)    ED Course/ Medical Decision Making/ A&P Clinical Course as of 01/18/22 1149  Fri Jan 18, 2022  0944 Labs do not suggest DKA. Will give an additional liter of IVF, 10  units of insulin, and recheck blood sugar.  [GL]    Clinical Course User Index [GL] Sherre Poot Adora Fridge, PA-C                           Medical Decision Making Amount and/or Complexity of Data Reviewed Labs: ordered.  Risk Prescription  drug management.    MDM  This is a 51 y.o. male who presents to the ED with hyperglycemia The differential of this patient includes but is not limited to DKA, HHS, Dehydration, Infection, medication noncompliance  Initial Impression  Well appearing and no acute distress He is tachycardic to 133, other vitals stable, not febrile. Exam unremarkable with nontender abdomen. Will give IVF due to likely dehydration  I personally ordered, reviewed, and interpreted all laboratory work and imaging and agree with radiologist interpretation. Results interpreted below: WBC 3.7, Glucose 555, CO2 25, AG 14, BHB 2.97, VBG without acidosis, UA with lots of glucose, no infection  Assessment/Plan:  He Is not in DKA or HHS.  There is no signs of infection.  He has gotten 2 L of lactated Ringer's as well as 10 units of insulin.  We rechecked blood sugar and it was 419.  Patient's symptoms have completely resolved. Tachycardia resolved. Hyperglycemia is likely 2/2 to running out of medication.  I feel that his blood sugar will continue to decrease and he does not need admission at this time.  I have refilled his home Levemir him to pick up from the pharmacy.   Charting Requirements Additional history is obtained from:  Independent historian External Records from outside source obtained and reviewed including: prior labs Social Determinants of Health:  none Pertinant PMH that complicates patient's illness: DM2   Patient Care Problems that were addressed during this visit: - Hyperglycemia: Acute illness with systemic symptoms This patient was maintained on a cardiac monitor/telemetry. I personally viewed and interpreted the cardiac monitor which reveals an underlying  rhythm of ST Medications given in ED: 2 L IVF, Insulin Reevaluation of the patient after these medicines showed that the patient resolved I have reviewed home medications and made changes accordingly.  Critical Care Interventions: n/a Consultations: n/a Disposition: discharge  This is a supervised visit with my attending physician, Dr. Zenia Resides. We have discussed this patient and they have altered the plan as needed.  Portions of this note were generated with Lobbyist. Dictation errors may occur despite best attempts at proofreading.      Final Clinical Impression(s) / ED Diagnoses Final diagnoses:  Hyperglycemia    Rx / DC Orders ED Discharge Orders          Ordered    insulin detemir (LEVEMIR) 100 UNIT/ML FlexPen  Daily at bedtime        01/18/22 1023    insulin detemir (LEVEMIR) 100 UNIT/ML FlexPen  Daily at bedtime        Pending              Adolphus Birchwood, PA-C 01/18/22 1149    Lacretia Leigh, MD 01/21/22 1016

## 2022-01-18 NOTE — ED Triage Notes (Signed)
Pt arrived via POV, c/o hyperglycemia. CBG readings at home just showing high. States ran out of levemir. Only been using novolog. Denies any n/v or diarrhea.

## 2022-01-18 NOTE — ED Notes (Signed)
Urinal at bedside.  

## 2022-01-18 NOTE — ED Notes (Signed)
Pt states understanding of dc instructions, importance of follow up, and prescription. Pt denies questions or concerns upon dc. Pt declined wheelchair assistance upon dc. Pt ambulated out of ed w/ steady gait. No belongings left in room upon dc.  

## 2022-01-18 NOTE — Discharge Instructions (Signed)
Your labs today look very reassuring. Your sugar has come down after fluids and insulin here. I have refilled your Levimir so please pick this up from the pharmacy and restart it tonight. Continue your Novolog as prescribed.

## 2022-06-10 ENCOUNTER — Encounter: Payer: Self-pay | Admitting: Family Medicine

## 2022-06-10 ENCOUNTER — Ambulatory Visit: Payer: Federal, State, Local not specified - PPO | Admitting: Family Medicine

## 2022-06-10 VITALS — BP 98/64 | HR 113 | Temp 97.8°F | Ht 72.0 in | Wt 136.4 lb

## 2022-06-10 DIAGNOSIS — Z1159 Encounter for screening for other viral diseases: Secondary | ICD-10-CM

## 2022-06-10 DIAGNOSIS — E1165 Type 2 diabetes mellitus with hyperglycemia: Secondary | ICD-10-CM | POA: Diagnosis not present

## 2022-06-10 LAB — URINALYSIS, ROUTINE W REFLEX MICROSCOPIC
Bilirubin Urine: NEGATIVE
Hgb urine dipstick: NEGATIVE
Ketones, ur: 15 — AB
Leukocytes,Ua: NEGATIVE
Nitrite: NEGATIVE
RBC / HPF: NONE SEEN (ref 0–?)
Specific Gravity, Urine: 1.015 (ref 1.000–1.030)
Total Protein, Urine: NEGATIVE
Urine Glucose: >=1000 — AB
Urobilinogen, UA: 0.2 (ref 0.0–1.0)
WBC, UA: NONE SEEN (ref 0–?)
pH: 5.5 (ref 5.0–8.0)

## 2022-06-10 LAB — MICROALBUMIN / CREATININE URINE RATIO
Creatinine,U: 29.2 mg/dL
Microalb Creat Ratio: 7.4 mg/g (ref 0.0–30.0)
Microalb, Ur: 2.2 mg/dL — ABNORMAL HIGH (ref 0.0–1.9)

## 2022-06-10 NOTE — Progress Notes (Signed)
Please call patient-did he end up going to the emergency room?  There is a lot of sugar in his urine as well and some ketones-this is concerning given that his sugar was so high.  If he is declining going to the emergency room, we really do need to get blood work on him.

## 2022-06-10 NOTE — Progress Notes (Signed)
Subjective:     Patient ID: Mark Fry, male    DOB: 08/12/1970, 52 y.o.   MRN: [id number]  Chief Complaint  Patient presents with   Establish Care    HPI Toc from Dr. Ethelene Hal  DM type 2-saw [facility] in 06/2019.   Has been in ER sev times. Working on [employer].  Was seeing Dr. Lynder Parents in August.  DM for 78yr. Levimir 22.  Novolog 3 units ac.  Sugars running 100-200(vague about this). Tid checks-first am, off work, before bed. Can't recall A1C but "good". Oph-not done.  No blurry vision.  Some thirst occ. Urinates more at home, not as much at work. Losing wt-not trying.  [place]  Some days, tired and drained.  May miss 1-3 days.    Health Maintenance Due  Topic Date Due   OPHTHALMOLOGY EXAM  Never done   Hepatitis C Screening  Never done   DTaP/Tdap/Td (1 - Tdap) Never done   COLONOSCOPY (Pts 45-471yrInsurance coverage will need to be confirmed)  Never done   Diabetic kidney evaluation - Urine ACR  03/29/2020   FOOT EXAM  03/29/2020   HEMOGLOBIN A1C  11/14/2021    Past Medical History:  Diagnosis Date   Diabetes mellitus without complication ([facility]   Nephrolithiasis     History reviewed. No pertinent surgical history.  Outpatient Medications Prior to Visit  Medication Sig Dispense Refill   insulin aspart (NOVOLOG) 100 UNIT/ML FlexPen Inject 3 Units into the skin 3 (three) times daily with meals. 15 mL 2   Insulin Pen Needle (PEN NEEDLES 3/16") 31G X 5 MM MISC Take Levemir at bedtime, NovoLog at mealtimes,  as directed 100 each 3   polyethylene glycol (MIRALAX / GLYCOLAX) 17 g packet Take 17 g by mouth daily as needed for mild constipation. 14 each 0   diclofenac (VOLTAREN) 50 MG EC tablet Take 1 tablet (50 mg total) by mouth 2 (two) times daily. With food (Patient not taking: Reported on 06/10/2022) 30 tablet 0   insulin detemir (LEVEMIR) 100 UNIT/ML FlexPen Inject 22 Units into the skin at bedtime. 15 mL 1   No facility-administered medications  prior to visit.    No Known Allergies ROS neg/noncontributory except as noted HPI/below ROS: Gen: no fever, chills  Skin: no rash, itching ENT: no ear pain, ear drainage, nasal congestion, rhinorrhea, sinus pressure, sore throat Eyes: no blurry vision, double vision Resp: no cough, wheeze,SOB CV: no CP, palpitations, LE edema,  GI: no heartburn, n/v/d/c, abd pain GU: no dysuria, urgency, frequency, hematuria MSK: no joint pain, myalgias, back pain Neuro: no dizziness, headache, weakness, vertigo Psych: no depression, anxiety, insomnia, SI      Objective:     BP 98/64 (BP Location: Right Arm, Patient Position: Sitting)   Pulse (!) 113   Temp 97.8 F (36.6 C) (Temporal)   Ht 6' (1.829 m)   Wt 136 lb 6.4 oz (61.9 kg)   SpO2 98%   BMI 18.50 kg/m  Wt Readings from Last 3 Encounters:  06/10/22 136 lb 6.4 oz (61.9 kg)  01/18/22 145 lb (65.8 kg)  05/16/21 180 lb (81.6 kg)    Physical Exam   Gen: WDWN NAD thin aam-smells a little of ketones HEENT: NCAT, conjunctiva not injected, sclera nonicteric NECK:  supple, no thyromegaly, no nodes, no carotid bruits CARDIAC: RRR, S1S2+, no murmur. DP 2+B LUNGS: CTAB. No wheezes ABDOMEN:  BS+, soft, NTND, No HSM, no masses  EXT:  no edema MSK: no gross abnormalities.  NEURO: A&O x3.  CN II-XII intact.  PSYCH: normal mood. Good eye contact  Glucose hi then 445 on check  Although not new pt, still spent 37mn getting hx, reviewing chart, educating pt       Assessment & Plan:   Problem List Items Addressed This Visit       Endocrine   Uncontrolled type 2 diabetes mellitus with hyperglycemia ([place] - [facility]   Relevant Orders   Amb ref to Medical Nutrition Therapy-MNT   Other Visit Diagnoses     Encounter for hepatitis C screening test for low risk patient          DM type 2-chronic.  ? Control.  Check cmp,cbc,tsh,lipids, A1C urine microalb/creat/UA, GAD.  I wonder if type 1 or other.   F/u 3 mo.  Cont meds-states not need  refill.  Refer nutritionist-lost wt, needs education.  Pt will bring fmla papers.  Educated on when to check sugars.  Sch ophth.    Didn't do labs as finger stick 445 and advised to go to ER.  Not sure if DKA, machine not working, other.    Wt loss-? Uncontrolled Dm, other-check labs.  Refer nutritionist.  No orders of the defined types were placed in this encounter.   AWellington Hampshire MD

## 2022-06-10 NOTE — Patient Instructions (Addendum)
Welcome to [facility]! It was a pleasure meeting you today.  As discussed, Please schedule a 3 month follow up visit today.  Get eye exam done Check on Tetanus-when  PLEASE NOTE:  If you had any LAB tests please let us know if you have not heard back within a few days. You may see your results on MyChart before we have a chance to review them but we will give you a call once they are reviewed by [place]. If we ordered any REFERRALS today, please let us know if you have not heard from their office within the next week.  Let us know through MyChart if you are needing REFILLS, or have your pharmacy send [place] the request. You can also use MyChart to communicate with me or any office staff.  Please try these tips to maintain a healthy lifestyle:  Eat most of your calories during the day when you are active. Eliminate processed foods including packaged sweets (pies, cakes, cookies), reduce intake of potatoes, white bread, white pasta, and white rice. Look for whole grain options, oat flour or almond flour.  Each meal should contain half fruits/vegetables, one quarter protein, and one quarter carbs (no bigger than a computer mouse).  Cut down on sweet beverages. This includes juice, soda, and sweet tea. Also watch fruit intake, though this is a healthier sweet option, it still contains natural sugar! Limit to 3 servings daily.  Drink at least 1 glass of water with each meal and aim for at least 8 glasses per day  Exercise at least 150 minutes every week.

## 2022-06-18 ENCOUNTER — Encounter: Payer: Self-pay | Admitting: *Deleted

## 2022-06-26 NOTE — Progress Notes (Signed)
This encounter was created in error - please disregard.

## 2023-02-04 ENCOUNTER — Encounter (HOSPITAL_COMMUNITY): Payer: Self-pay

## 2023-02-04 ENCOUNTER — Inpatient Hospital Stay (HOSPITAL_COMMUNITY)
Admission: EM | Admit: 2023-02-04 | Discharge: 2023-02-06 | DRG: 638 | Disposition: A | Discharge status: Home / Self Care | Payer: Federal, State, Local not specified - PPO | Attending: Internal Medicine | Admitting: Internal Medicine

## 2023-02-04 DIAGNOSIS — Z794 Long term (current) use of insulin: Secondary | ICD-10-CM | POA: Diagnosis not present

## 2023-02-04 DIAGNOSIS — E1110 Type 2 diabetes mellitus with ketoacidosis without coma: Secondary | ICD-10-CM | POA: Diagnosis not present

## 2023-02-04 DIAGNOSIS — E785 Hyperlipidemia, unspecified: Secondary | ICD-10-CM | POA: Diagnosis not present

## 2023-02-04 DIAGNOSIS — E876 Hypokalemia: Secondary | ICD-10-CM | POA: Diagnosis not present

## 2023-02-04 DIAGNOSIS — E1010 Type 1 diabetes mellitus with ketoacidosis without coma: Secondary | ICD-10-CM | POA: Diagnosis not present

## 2023-02-04 DIAGNOSIS — D72810 Lymphocytopenia: Secondary | ICD-10-CM | POA: Diagnosis not present

## 2023-02-04 DIAGNOSIS — I10 Essential (primary) hypertension: Secondary | ICD-10-CM | POA: Diagnosis not present

## 2023-02-04 DIAGNOSIS — R42 Dizziness and giddiness: Secondary | ICD-10-CM | POA: Diagnosis not present

## 2023-02-04 DIAGNOSIS — Z91148 Patient's other noncompliance with medication regimen for other reason: Secondary | ICD-10-CM | POA: Diagnosis not present

## 2023-02-04 DIAGNOSIS — R17 Unspecified jaundice: Secondary | ICD-10-CM | POA: Diagnosis not present

## 2023-02-04 DIAGNOSIS — I700 Atherosclerosis of aorta: Secondary | ICD-10-CM | POA: Diagnosis not present

## 2023-02-04 DIAGNOSIS — Z833 Family history of diabetes mellitus: Secondary | ICD-10-CM

## 2023-02-04 DIAGNOSIS — R739 Hyperglycemia, unspecified: Secondary | ICD-10-CM | POA: Diagnosis not present

## 2023-02-04 DIAGNOSIS — D72819 Decreased white blood cell count, unspecified: Secondary | ICD-10-CM | POA: Diagnosis not present

## 2023-02-04 HISTORY — DX: Hyperlipidemia, unspecified: E78.5

## 2023-02-04 LAB — COMPREHENSIVE METABOLIC PANEL
ALT: 20 U/L (ref 0–44)
AST: 19 U/L (ref 15–41)
Albumin: 4.1 g/dL (ref 3.5–5.0)
Alkaline Phosphatase: 119 U/L (ref 38–126)
Anion gap: >20 — ABNORMAL HIGH (ref 5–15)
BUN: 18 mg/dL (ref 6–20)
CO2: 11 mmol/L — ABNORMAL LOW (ref 22–32)
Calcium: 9.3 mg/dL (ref 8.9–10.3)
Chloride: 101 mmol/L (ref 98–111)
Creatinine, Ser: 0.98 mg/dL (ref 0.61–1.24)
GFR, Estimated: >60 mL/min (ref >60)
Glucose, Bld: 388 mg/dL — ABNORMAL HIGH (ref 70–99)
Potassium: 4.5 mmol/L (ref 3.5–5.1)
Sodium: 135 mmol/L (ref 135–145)
Total Bilirubin: 1.6 mg/dL — ABNORMAL HIGH (ref 0.3–1.2)
Total Protein: 7.9 g/dL (ref 6.5–8.1)

## 2023-02-04 LAB — URINALYSIS, W/ REFLEX TO CULTURE (INFECTION SUSPECTED)
Bacteria, UA: NONE SEEN
Bilirubin Urine: NEGATIVE
Glucose, UA: >=500 mg/dL — AB
Ketones, ur: 80 mg/dL — AB
Leukocytes,Ua: NEGATIVE
Nitrite: NEGATIVE
Protein, ur: 30 mg/dL — AB
Specific Gravity, Urine: 1.029 (ref 1.005–1.030)
pH: 5 (ref 5.0–8.0)

## 2023-02-04 LAB — CBC WITH DIFFERENTIAL/PLATELET
Abs Immature Granulocytes: 0.02 10*3/uL (ref 0.00–0.07)
Basophils Absolute: 0 10*3/uL (ref 0.0–0.1)
Basophils Relative: 1 %
Eosinophils Absolute: 0 10*3/uL (ref 0.0–0.5)
Eosinophils Relative: 1 %
HCT: 46.4 % (ref 39.0–52.0)
Hemoglobin: 15 g/dL (ref 13.0–17.0)
Immature Granulocytes: 1 %
Lymphocytes Relative: 21 %
Lymphs Abs: 0.8 10*3/uL (ref 0.7–4.0)
MCH: 27.4 pg (ref 26.0–34.0)
MCHC: 32.3 g/dL (ref 30.0–36.0)
MCV: 84.7 fL (ref 80.0–100.0)
Monocytes Absolute: 0.2 10*3/uL (ref 0.1–1.0)
Monocytes Relative: 6 %
Neutro Abs: 2.5 10*3/uL (ref 1.7–7.7)
Neutrophils Relative %: 70 %
Platelets: 197 10*3/uL (ref 150–400)
RBC: 5.48 MIL/uL (ref 4.22–5.81)
RDW: 13.5 % (ref 11.5–15.5)
WBC: 3.6 10*3/uL — ABNORMAL LOW (ref 4.0–10.5)
nRBC: 0 % (ref 0.0–0.2)

## 2023-02-04 LAB — BLOOD GAS, VENOUS
Acid-base deficit: 13.2 mmol/L — ABNORMAL HIGH (ref 0.0–2.0)
Bicarbonate: 13.6 mmol/L — ABNORMAL LOW (ref 20.0–28.0)
O2 Saturation: 81.6 %
Patient temperature: 37
pCO2, Ven: 34 mmHg — ABNORMAL LOW (ref 44–60)
pH, Ven: 7.21 — ABNORMAL LOW (ref 7.25–7.43)
pO2, Ven: 50 mmHg — ABNORMAL HIGH (ref 32–45)

## 2023-02-04 LAB — BASIC METABOLIC PANEL
Anion gap: 17 — ABNORMAL HIGH (ref 5–15)
Anion gap: 9 (ref 5–15)
BUN: 16 mg/dL (ref 6–20)
BUN: 13 mg/dL (ref 6–20)
CO2: 13 mmol/L — ABNORMAL LOW (ref 22–32)
CO2: 18 mmol/L — ABNORMAL LOW (ref 22–32)
Calcium: 8.5 mg/dL — ABNORMAL LOW (ref 8.9–10.3)
Calcium: 8.1 mg/dL — ABNORMAL LOW (ref 8.9–10.3)
Chloride: 105 mmol/L (ref 98–111)
Chloride: 106 mmol/L (ref 98–111)
Creatinine, Ser: 0.88 mg/dL (ref 0.61–1.24)
Creatinine, Ser: 0.67 mg/dL (ref 0.61–1.24)
GFR, Estimated: >60 mL/min (ref >60)
GFR, Estimated: >60 mL/min (ref >60)
Glucose, Bld: 293 mg/dL — ABNORMAL HIGH (ref 70–99)
Glucose, Bld: 225 mg/dL — ABNORMAL HIGH (ref 70–99)
Potassium: 4.9 mmol/L (ref 3.5–5.1)
Potassium: 3.7 mmol/L (ref 3.5–5.1)
Sodium: 135 mmol/L (ref 135–145)
Sodium: 133 mmol/L — ABNORMAL LOW (ref 135–145)

## 2023-02-04 LAB — GLUCOSE, CAPILLARY
Glucose-Capillary: 206 mg/dL — ABNORMAL HIGH (ref 70–99)
Glucose-Capillary: 193 mg/dL — ABNORMAL HIGH (ref 70–99)
Glucose-Capillary: 161 mg/dL — ABNORMAL HIGH (ref 70–99)
Glucose-Capillary: 153 mg/dL — ABNORMAL HIGH (ref 70–99)
Glucose-Capillary: 134 mg/dL — ABNORMAL HIGH (ref 70–99)

## 2023-02-04 LAB — I-STAT CG4 LACTIC ACID, ED: Lactic Acid, Venous: 1.7 mmol/L (ref 0.5–1.9)

## 2023-02-04 LAB — MRSA NEXT GEN BY PCR, NASAL: MRSA by PCR Next Gen: NOT DETECTED

## 2023-02-04 LAB — BETA-HYDROXYBUTYRIC ACID
Beta-Hydroxybutyric Acid: >8 mmol/L — ABNORMAL HIGH (ref 0.05–0.27)
Beta-Hydroxybutyric Acid: 3.34 mmol/L — ABNORMAL HIGH (ref 0.05–0.27)

## 2023-02-04 LAB — CBG MONITORING, ED
Glucose-Capillary: 376 mg/dL — ABNORMAL HIGH (ref 70–99)
Glucose-Capillary: 287 mg/dL — ABNORMAL HIGH (ref 70–99)
Glucose-Capillary: 244 mg/dL — ABNORMAL HIGH (ref 70–99)
Glucose-Capillary: 210 mg/dL — ABNORMAL HIGH (ref 70–99)

## 2023-02-04 MED ORDER — ORAL CARE MOUTH RINSE: 15.0000 mL | OROMUCOSAL | Status: DC | PRN

## 2023-02-04 MED ORDER — ONDANSETRON HCL 4 MG/2ML IJ SOLN: 4.0000 mg | Freq: Four times a day (QID) | INTRAMUSCULAR | Status: DC | PRN

## 2023-02-04 MED ORDER — DEXTROSE 50 % IV SOLN: 0.0000 mL | INTRAVENOUS | Status: DC | PRN

## 2023-02-04 MED ORDER — ACETAMINOPHEN 650 MG RE SUPP: 650.0000 mg | Freq: Four times a day (QID) | RECTAL | Status: DC | PRN

## 2023-02-04 MED ORDER — LACTATED RINGERS IV SOLN
INTRAVENOUS | Status: DC
  Administered 2023-02-05: 125 mL via INTRAVENOUS

## 2023-02-04 MED ORDER — CHLORHEXIDINE GLUCONATE CLOTH 2 % EX PADS
6.0000 | MEDICATED_PAD | Freq: Every day | CUTANEOUS | Status: DC
  Administered 2023-02-04 – 2023-02-06 (×3): 6 via TOPICAL

## 2023-02-04 MED ORDER — ONDANSETRON HCL 4 MG PO TABS: 4.0000 mg | ORAL_TABLET | Freq: Four times a day (QID) | ORAL | Status: DC | PRN

## 2023-02-04 MED ORDER — ENOXAPARIN SODIUM 40 MG/0.4ML IJ SOSY
40.0000 mg | PREFILLED_SYRINGE | INTRAMUSCULAR | Status: DC
  Administered 2023-02-04 – 2023-02-05 (×2): 40 mg via SUBCUTANEOUS
  Filled 2023-02-04 (×2): qty 0.4

## 2023-02-04 MED ORDER — SODIUM CHLORIDE 0.9 % IV BOLUS
1000.0000 mL | Freq: Once | INTRAVENOUS | Status: AC
  Administered 2023-02-04: 1000 mL via INTRAVENOUS

## 2023-02-04 MED ORDER — DEXTROSE IN LACTATED RINGERS 5 % IV SOLN: INTRAVENOUS | Status: DC

## 2023-02-04 MED ORDER — INSULIN REGULAR(HUMAN) IN NACL 100-0.9 UT/100ML-% IV SOLN
INTRAVENOUS | Status: DC
  Administered 2023-02-04: 6 [IU]/h via INTRAVENOUS
  Filled 2023-02-04: qty 100

## 2023-02-04 MED ORDER — POTASSIUM CHLORIDE 10 MEQ/100ML IV SOLN
10.0000 meq | INTRAVENOUS | Status: AC
  Administered 2023-02-04 (×2): 10 meq via INTRAVENOUS
  Filled 2023-02-04 (×2): qty 100

## 2023-02-04 MED ORDER — ACETAMINOPHEN 325 MG PO TABS: 650.0000 mg | ORAL_TABLET | Freq: Four times a day (QID) | ORAL | Status: DC | PRN

## 2023-02-04 MED ORDER — LACTATED RINGERS IV BOLUS
20.0000 mL/kg | Freq: Once | INTRAVENOUS | Status: AC
  Administered 2023-02-04: 1220 mL via INTRAVENOUS

## 2023-02-04 NOTE — ED Triage Notes (Signed)
BIB [facility] from home with c/o hyperglycemia and "not feeling well", did not take any insulin yesterday. BG 373 A&O x4

## 2023-02-04 NOTE — ED Notes (Signed)
5.5units per endotool, recheck @ [phone]

## 2023-02-04 NOTE — ED Notes (Signed)
3.4units recheck at 1906

## 2023-02-04 NOTE — ED Notes (Signed)
4.4units, recheck @ 1740

## 2023-02-04 NOTE — ED Provider Notes (Signed)
[place] EMERGENCY DEPARTMENT AT [facility] Provider Note   CSN: [id number] Arrival date & time: 02/04/23  1026     History  Chief Complaint  Patient presents with   Hyperglycemia    Mark Fry is a 52 y.o. male.  HPI Patient presents with concern for Polly urea, generalized discomfort.  He notes that he has been taking his medication as directed until today.  He notes symptoms are somewhat similar to those he is experienced with prior episodes of hyperglycemia.  No focal pain, headache, vomiting.    Home Medications Prior to Admission medications   Medication Sig Start Date End Date Taking? Authorizing Provider  insulin aspart (NOVOLOG) 100 UNIT/ML FlexPen Inject 3 Units into the skin 3 (three) times daily with meals. 05/20/21   Standley Brooking, MD  Insulin Pen Needle (PEN NEEDLES 3/16") 31G X 5 MM MISC Take Levemir at bedtime, NovoLog at mealtimes,  as directed 05/20/21   Standley Brooking, MD  polyethylene glycol (MIRALAX / GLYCOLAX) 17 g packet Take 17 g by mouth daily as needed for mild constipation. 05/20/21   Standley Brooking, MD      Allergies    Patient has no known allergies.    Review of Systems   Review of Systems  All other systems reviewed and are negative.   Physical Exam Updated Vital Signs BP 114/86   Pulse (!) 108   Temp 97.8 F (36.6 C) (Oral)   Resp 15   SpO2 99%  Physical Exam Vitals and nursing note reviewed.  Constitutional:      General: He is not in acute distress.    Appearance: He is well-developed.  HENT:     Head: Normocephalic and atraumatic.  Eyes:     Conjunctiva/sclera: Conjunctivae normal.  Cardiovascular:     Rate and Rhythm: Regular rhythm. Tachycardia present.  Pulmonary:     Effort: Pulmonary effort is normal. No respiratory distress.     Breath sounds: No stridor.  Abdominal:     General: There is no distension.  Skin:    General: Skin is warm and dry.  Neurological:     Mental Status: He is  alert and oriented to person, place, and time.     ED Results / Procedures / Treatments   Labs (all labs ordered are listed, but only abnormal results are displayed) Labs Reviewed  COMPREHENSIVE METABOLIC PANEL - Abnormal; Notable for the following components:      Result Value   CO2 11 (*)    Glucose, Bld 388 (*)    Total Bilirubin 1.6 (*)    Anion gap >20 (*)    All other components within normal limits  CBC WITH DIFFERENTIAL/PLATELET - Abnormal; Notable for the following components:   WBC 3.6 (*)    All other components within normal limits  URINALYSIS, W/ REFLEX TO CULTURE (INFECTION SUSPECTED) - Abnormal; Notable for the following components:   Color, Urine STRAW (*)    Glucose, UA >=500 (*)    Hgb urine dipstick SMALL (*)    Ketones, ur 80 (*)    Protein, ur 30 (*)    All other components within normal limits  BLOOD GAS, VENOUS - Abnormal; Notable for the following components:   pH, Ven 7.21 (*)    pCO2, Ven 34 (*)    pO2, Ven 50 (*)    Bicarbonate 13.6 (*)    Acid-base deficit 13.2 (*)    All other components within normal limits  BETA-HYDROXYBUTYRIC ACID  BETA-HYDROXYBUTYRIC ACID  URINALYSIS, ROUTINE W REFLEX MICROSCOPIC  I-STAT CG4 LACTIC ACID, ED  I-STAT CG4 LACTIC ACID, ED  CBG MONITORING, ED    EKG None  Radiology No results found.  Procedures Procedures    Medications Ordered in ED Medications  lactated ringers bolus 20 mL/kg (has no administration in time range)  insulin regular, human (MYXREDLIN) 100 units/ 100 mL infusion (has no administration in time range)  lactated ringers infusion (has no administration in time range)  dextrose 5 % in lactated ringers infusion (has no administration in time range)  dextrose 50 % solution 0-50 mL (has no administration in time range)  potassium chloride 10 mEq in 100 mL IVPB (has no administration in time range)  sodium chloride 0.9 % bolus 1,000 mL (1,000 mLs Intravenous New Bag/Given 02/04/23 1122)     ED Course/ Medical Decision Making/ A&P                                 Medical Decision Making Adult male with insulin-dependent diabetes presents with nausea, weakness, anorexia and polyuria.  Concern for complications of diabetes.  Physical exam initially reassuring, no fever, no hypotension, no early evidence for infection.  Cardiac 105 sinus tach abnormal Pulse ox 100% room air normal   Amount and/or Complexity of Data Reviewed External Data Reviewed: notes. Labs: ordered. Decision-making details documented in ED Course.  Risk Prescription drug management. Decision regarding hospitalization. Diagnosis or treatment significantly limited by social determinants of health.   1:10 PM Patient with anion gap greater than 20, acidosis pH 7.2, tachycardia and ketonuria all consistent with DKA.  Patient starting on continuous insulin, with potassium repletion, given concern for findings as above, DKA, patient admitted for further monitoring, management.  CRITICAL CARE Performed by: Gerhard Munch Total critical care time: 45 minutes Critical care time was exclusive of separately billable procedures and treating other patients. Critical care was necessary to treat or prevent imminent or life-threatening deterioration. Critical care was time spent personally by me on the following activities: development of treatment plan with patient and/or surrogate as well as nursing, discussions with consultants, evaluation of patient's response to treatment, examination of patient, obtaining history from patient or surrogate, ordering and performing treatments and interventions, ordering and review of laboratory studies, ordering and review of radiographic studies, pulse oximetry and re-evaluation of patient's condition.   Final Clinical Impression(s) / ED Diagnoses Final diagnoses:  Diabetic ketoacidosis without coma associated with type 1 diabetes mellitus [facility])    Rx / DC Orders ED  Discharge Orders     None         Gerhard Munch, MD 02/04/23 1311

## 2023-02-04 NOTE — ED Notes (Signed)
ED TO INPATIENT HANDOFF REPORT  ED Nurse Name and Phone #: Linus Orn Name/Age/Gender Mark Fry 52 y.o. male Room/Bed: WA23/WA23  Code Status   Code Status: Full Code  Home/SNF/Other Home Patient oriented to: self, place, time, and situation Is this baseline? Yes   Triage Complete: Triage complete  Chief Complaint DKA (diabetic ketoacidosis) (HCC) [E11.10]  Triage Note BIB [facility] from home with c/o hyperglycemia and "not feeling well", did not take any insulin yesterday. BG 373 A&O x4   Allergies No Known Allergies  Level of Care/Admitting Diagnosis ED Disposition     ED Disposition  Admit   Condition  --   Comment  Hospital Area: [facility] [[id number]]  Level of Care: Stepdown [14]  Admit to SDU based on following criteria: Severe physiological/psychological symptoms:  Any diagnosis requiring assessment & intervention at least every 4 hours on an ongoing basis to obtain desired patient outcomes including stability and rehabilitation  May place patient in observation at [facility] or [facility] if equivalent level of care is available:: No  Covid Evaluation: Asymptomatic - no recent exposure (last 10 days) testing not required  Diagnosis: DKA (diabetic ketoacidosis) [facility]) [409811]  Admitting Physician: Bobette Mo [[id number]]  Attending Physician: Bobette Mo [[id number]]          B Medical/Surgery History Past Medical History:  Diagnosis Date   Diabetes mellitus without complication (HCC)    Nephrolithiasis    History reviewed. No pertinent surgical history.   A IV Location/Drains/Wounds Patient Lines/Drains/Airways Status     Active Line/Drains/Airways     Name Placement date Placement time Site Days   Peripheral IV 02/04/23 20 G Anterior;Proximal;Right Forearm 02/04/23  1056  Forearm  less than 1   Peripheral IV 02/04/23 20 G Left;Posterior;Proximal Forearm 02/04/23  1504  Forearm  less than 1             Intake/Output Last 24 hours No intake or output data in the 24 hours ending 02/04/23 1702  Labs/Imaging Results for orders placed or performed during the hospital encounter of 02/04/23 (from the past 48 hour(s))  Comprehensive metabolic panel     Status: Abnormal   Collection Time: 02/04/23 10:50 AM  Result Value Ref Range   Sodium 135 135 - 145 mmol/L    Comment: ELECTROLYTES REPEATED TO VERIFY   Potassium 4.5 3.5 - 5.1 mmol/L   Chloride 101 98 - 111 mmol/L    Comment: ELECTROLYTES REPEATED TO VERIFY   CO2 11 (L) 22 - 32 mmol/L    Comment: ELECTROLYTES REPEATED TO VERIFY   Glucose, Bld 388 (H) 70 - 99 mg/dL    Comment: Glucose reference range applies only to samples taken after fasting for at least 8 hours.   BUN 18 6 - 20 mg/dL   Creatinine, Ser 6.57 0.61 - 1.24 mg/dL   Calcium 9.3 8.9 - 84.6 mg/dL   Total Protein 7.9 6.5 - 8.1 g/dL   Albumin 4.1 3.5 - 5.0 g/dL   AST 19 15 - 41 U/L   ALT 20 0 - 44 U/L   Alkaline Phosphatase 119 38 - 126 U/L   Total Bilirubin 1.6 (H) 0.3 - 1.2 mg/dL   GFR, Estimated >96 >29 mL/min    Comment: (NOTE) Calculated using the CKD-EPI Creatinine Equation (2021)    Anion gap >20 (H) 5 - 15    Comment: ELECTROLYTES REPEATED TO VERIFY Performed at [facility], [address] [place], [place]  [address]   CBC with Differential     Status: Abnormal   Collection Time: 02/04/23 10:50 AM  Result Value Ref Range   WBC 3.6 (L) 4.0 - 10.5 K/uL   RBC 5.48 4.22 - 5.81 MIL/uL   Hemoglobin 15.0 13.0 - 17.0 g/dL   HCT 56.4 33.2 - 95.1 %   MCV 84.7 80.0 - 100.0 fL   MCH 27.4 26.0 - 34.0 pg   MCHC 32.3 30.0 - 36.0 g/dL   RDW 88.4 16.6 - 06.3 %   Platelets 197 150 - 400 K/uL   nRBC 0.0 0.0 - 0.2 %   Neutrophils Relative % 70 %   Neutro Abs 2.5 1.7 - 7.7 K/uL   Lymphocytes Relative 21 %   Lymphs Abs 0.8 0.7 - 4.0 K/uL   Monocytes Relative 6 %   Monocytes Absolute 0.2 0.1 - 1.0 K/uL   Eosinophils Relative 1 %   Eosinophils  Absolute 0.0 0.0 - 0.5 K/uL   Basophils Relative 1 %   Basophils Absolute 0.0 0.0 - 0.1 K/uL   Immature Granulocytes 1 %   Abs Immature Granulocytes 0.02 0.00 - 0.07 K/uL    Comment: Performed at [facility], [address], [place], [place] [address]  Beta-hydroxybutyric acid     Status: Abnormal   Collection Time: 02/04/23 10:50 AM  Result Value Ref Range   Beta-Hydroxybutyric Acid >8.00 (H) 0.05 - 0.27 mmol/L    Comment: RESULT CONFIRMED BY MANUAL DILUTION Performed at [facility], [address], [place], [place] [address]   Urinalysis, w/ Reflex to Culture (Infection Suspected) -Urine, Clean Catch     Status: Abnormal   Collection Time: 02/04/23 11:01 AM  Result Value Ref Range   Specimen Source URINE, CLEAN CATCH    Color, Urine STRAW (A) YELLOW   APPearance CLEAR CLEAR   Specific Gravity, Urine 1.029 1.005 - 1.030   pH 5.0 5.0 - 8.0   Glucose, UA >=500 (A) NEGATIVE mg/dL   Hgb urine dipstick SMALL (A) NEGATIVE   Bilirubin Urine NEGATIVE NEGATIVE   Ketones, ur 80 (A) NEGATIVE mg/dL   Protein, ur 30 (A) NEGATIVE mg/dL   Nitrite NEGATIVE NEGATIVE   Leukocytes,Ua NEGATIVE NEGATIVE   RBC / HPF 0-5 0 - 5 RBC/hpf   WBC, UA 0-5 0 - 5 WBC/hpf    Comment:        Reflex urine culture not performed if WBC <=10, OR if Squamous epithelial cells >5. If Squamous epithelial cells >5 suggest recollection.    Bacteria, UA NONE SEEN NONE SEEN   Squamous Epithelial / HPF 0-5 0 - 5 /HPF    Comment: Performed at [facility], [address], [place], [place] [address]  I-Stat Lactic Acid     Status: None   Collection Time: 02/04/23 11:15 AM  Result Value Ref Range   Lactic Acid, Venous 1.7 0.5 - 1.9 mmol/L  Blood gas, venous     Status: Abnormal   Collection Time: 02/04/23 11:20 AM  Result Value Ref Range   pH, Ven 7.21 (L) 7.25 - 7.43   pCO2, Ven 34 (L) 44 - 60 mmHg   pO2, Ven 50 (H) 32 - 45 mmHg   Bicarbonate 13.6 (L) 20.0  - 28.0 mmol/L   Acid-base deficit 13.2 (H) 0.0 - 2.0 mmol/L   O2 Saturation 81.6 %   Patient temperature 37.0     Comment: Performed at [facility], [address] [place],  [place] [address]  CBG monitoring, ED     Status: Abnormal   Collection Time: 02/04/23  2:09 PM  Result Value Ref Range   Glucose-Capillary 376 (H) 70 - 99 mg/dL    Comment: Glucose reference range applies only to samples taken after fasting for at least 8 hours.  CBG monitoring, ED     Status: Abnormal   Collection Time: 02/04/23  3:17 PM  Result Value Ref Range   Glucose-Capillary 287 (H) 70 - 99 mg/dL    Comment: Glucose reference range applies only to samples taken after fasting for at least 8 hours.  Basic metabolic panel     Status: Abnormal   Collection Time: 02/04/23  3:47 PM  Result Value Ref Range   Sodium 135 135 - 145 mmol/L   Potassium 4.9 3.5 - 5.1 mmol/L   Chloride 105 98 - 111 mmol/L   CO2 13 (L) 22 - 32 mmol/L   Glucose, Bld 293 (H) 70 - 99 mg/dL    Comment: Glucose reference range applies only to samples taken after fasting for at least 8 hours.   BUN 16 6 - 20 mg/dL   Creatinine, Ser 2.13 0.61 - 1.24 mg/dL   Calcium 8.5 (L) 8.9 - 10.3 mg/dL   GFR, Estimated >08 >65 mL/min    Comment: (NOTE) Calculated using the CKD-EPI Creatinine Equation (2021)    Anion gap 17 (H) 5 - 15    Comment: Performed at [facility], [address], [place], [place] [address]  CBG monitoring, ED     Status: Abnormal   Collection Time: 02/04/23  4:35 PM  Result Value Ref Range   Glucose-Capillary 244 (H) 70 - 99 mg/dL    Comment: Glucose reference range applies only to samples taken after fasting for at least 8 hours.   No results found.  Pending Labs Unresulted Labs (From admission, onward)     Start     Ordered   02/05/23 0500  HIV Antibody (routine testing w rflx)  (HIV Antibody (Routine testing w reflex) panel)  Tomorrow morning,   R        02/04/23 1521    02/05/23 0500  CBC  Tomorrow morning,   R        02/04/23 1521   02/05/23 0500  Comprehensive metabolic panel  Tomorrow morning,   R        02/04/23 1521   02/04/23 1519  Hemoglobin A1c  (Diabetes Ketoacidosis (DKA))  Add-on,   AD       Comments: To assess prior glycemic control.    02/04/23 1519   02/04/23 1518  Basic metabolic panel  (Diabetes Ketoacidosis (DKA))  STAT Now then every 4 hours ,   R (with STAT occurrences)      02/04/23 1519   02/04/23 1310  Beta-hydroxybutyric acid  (Diabetes Ketoacidosis (DKA))  Now then every 8 hours,   STAT (with URGENT occurrences)      02/04/23 1310            Vitals/Pain Today's Vitals   02/04/23 1033 02/04/23 1045 02/04/23 1051 02/04/23 1455  BP:  114/86    Pulse:  (!) 108    Resp:   15   Temp:   97.8 F (36.6 C)   TempSrc:   Oral   SpO2:  99%    Weight:    61 kg  PainSc: 0-No pain       Isolation Precautions No active isolations  Medications Medications  lactated ringers bolus 1,220 mL (has no administration in time range)  insulin regular, human (MYXREDLIN) 100 units/ 100 mL infusion (6 Units/hr Intravenous New Bag/Given 02/04/23 1413)  lactated ringers infusion (0 mLs Intravenous Stopped 02/04/23 1645)  dextrose 5 % in lactated ringers infusion ( Intravenous New Bag/Given 02/04/23 1645)  dextrose 50 % solution 0-50 mL (has no administration in time range)  enoxaparin (LOVENOX) injection 40 mg (has no administration in time range)  acetaminophen (TYLENOL) tablet 650 mg (has no administration in time range)    Or  acetaminophen (TYLENOL) suppository 650 mg (has no administration in time range)  ondansetron (ZOFRAN) tablet 4 mg (has no administration in time range)    Or  ondansetron (ZOFRAN) injection 4 mg (has no administration in time range)  sodium chloride 0.9 % bolus 1,000 mL (0 mLs Intravenous Stopped 02/04/23 1622)  potassium chloride 10 mEq in 100 mL IVPB (0 mEq Intravenous Stopped 02/04/23 1623)    Mobility walks      Focused Assessments Hypergylcemia   R Recommendations: See Admitting Provider Note  Report given to:   Additional Notes: aaox4, walks, family at bedside.

## 2023-02-04 NOTE — H&P (Signed)
History and Physical    Patient: Mark Fry NWG:[id number] DOB: 12-Oct-1970 DOA: 02/04/2023 DOS: the patient was seen and examined on 02/04/2023 PCP: Jeani Sow, MD  Patient coming from: Home  Chief Complaint:  Chief Complaint  Patient presents with   Hyperglycemia   HPI: Mark Fry is a 52 y.o. male with medical history significant of type 2 diabetes, nephrolithiasis, who is coming to the emergency department due to hyperglycemia, polyuria and polydipsia after missing his insulin yesterday. He denied fever, chills, rhinorrhea, sore throat, wheezing or hemoptysis.  No chest pain, palpitations, diaphoresis, PND, orthopnea or pitting edema of the lower extremities.  No abdominal pain, nausea, emesis, diarrhea, constipation, melena or hematochezia.  No flank pain, dysuria, frequency or hematuria.    Lab work: His urine analysis showed glucose greater than 500, ketones of 80 and protein of 30 mg/dL.  There was small hemoglobin.  CBC showed a white count of 3.6, hemoglobin 15.0 g deciliter platelets 197.  CMP showed a CO2 of 11 with an anion gap greater than 20, glucose 388 and total bilirubin 1.6 mg/dL.  ED course: Initial vital signs were temperature 97.8 F, pulse 108, respiration 15, BP 114/86 mmHg O2 sat 99% on room air.  The patient received IV fluids,, potassium replacement and was started on an insulin infusion.  Review of Systems: As mentioned in the history of present illness. All other systems reviewed and are negative. Past Medical History:  Diagnosis Date   Diabetes mellitus without complication (HCC)    Nephrolithiasis    History reviewed. No pertinent surgical history. Social History:  reports that he has never smoked. He has never used smokeless tobacco. He reports that he does not drink alcohol and does not use drugs.  No Known Allergies  Family History  Problem Relation Age of Onset   Diabetes Father    Cancer - Colon Sister     Prior to Admission  medications   Medication Sig Start Date End Date Taking? Authorizing Provider  insulin aspart (NOVOLOG) 100 UNIT/ML FlexPen Inject 3 Units into the skin 3 (three) times daily with meals. 05/20/21   Standley Brooking, MD  Insulin Pen Needle (PEN NEEDLES 3/16") 31G X 5 MM MISC Take Levemir at bedtime, NovoLog at mealtimes,  as directed 05/20/21   Standley Brooking, MD  polyethylene glycol (MIRALAX / GLYCOLAX) 17 g packet Take 17 g by mouth daily as needed for mild constipation. 05/20/21   Standley Brooking, MD    Physical Exam: Vitals:   02/04/23 1045 02/04/23 1051 02/04/23 1455  BP: 114/86    Pulse: (!) 108    Resp:  15   Temp:  97.8 F (36.6 C)   TempSrc:  Oral   SpO2: 99%    Weight:   61 kg   Physical Exam Vitals and nursing note reviewed.  Constitutional:      General: He is awake. He is not in acute distress.    Appearance: Normal appearance. He is underweight.  HENT:     Head: Normocephalic.     Nose: No rhinorrhea.     Mouth/Throat:     Mouth: Mucous membranes are dry.  Eyes:     General: No scleral icterus.    Pupils: Pupils are equal, round, and reactive to light.  Neck:     Vascular: No JVD.  Cardiovascular:     Rate and Rhythm: Regular rhythm. Tachycardia present.     Heart sounds: S1 normal and S2 normal.  Pulmonary:     Effort: Pulmonary effort is normal.     Breath sounds: Normal breath sounds. No wheezing, rhonchi or rales.  Abdominal:     General: Bowel sounds are normal. There is no distension.     Palpations: Abdomen is soft.     Tenderness: There is no abdominal tenderness. There is no guarding.  Musculoskeletal:     Cervical back: Neck supple.     Right lower leg: No edema.     Left lower leg: No edema.  Skin:    General: Skin is warm and dry.  Neurological:     General: No focal deficit present.     Mental Status: He is alert and oriented to person, place, and time.  Psychiatric:        Mood and Affect: Mood normal.        Behavior: Behavior  normal. Behavior is cooperative.   Data Reviewed:  Results are pending, will review when available.  Assessment and Plan: Principal Problem:   DKA (diabetic ketoacidosis) (HCC) IP/stepdown. Keep NPO. Continue IV fluids. Continue insulin infusion. Monitor CBG closely. BMP every 4 hours. BHA every 8 hours. Replace electrolytes as needed. Consult diabetes coordinator. Transition to SQ insulin per Endo tool.  Active Problems:   Aortic atherosclerosis (HCC)   Hyperlipidemia Not on statin. Follow-up with primary care provider.    Hypertension Currently normotensive. Not on antihypertensives. As needed therapy while in the hospital.    Hyperbilirubinemia Recheck bilirubin in a.m. after hydration.    Leukopenia Follow WBC in AM.    Advance Care Planning:   Code Status: Full Code   Consults:   Family Communication:   Severity of Illness: The appropriate patient status for this patient is OBSERVATION. Observation status is judged to be reasonable and necessary in order to provide the required intensity of service to ensure the patient's safety. The patient's presenting symptoms, physical exam findings, and initial radiographic and laboratory data in the context of their medical condition is felt to place them at decreased risk for further clinical deterioration. Furthermore, it is anticipated that the patient will be medically stable for discharge from the hospital within 2 midnights of admission.   Author: Bobette Mo, MD 02/04/2023 3:17 PM  For on call review [email or web address].   This document was prepared using Dragon voice recognition software and may contain some unintended transcription errors.

## 2023-02-05 ENCOUNTER — Other Ambulatory Visit: Payer: Self-pay

## 2023-02-05 ENCOUNTER — Other Ambulatory Visit (HOSPITAL_COMMUNITY): Payer: Self-pay

## 2023-02-05 DIAGNOSIS — E1110 Type 2 diabetes mellitus with ketoacidosis without coma: Secondary | ICD-10-CM | POA: Diagnosis not present

## 2023-02-05 LAB — GLUCOSE, CAPILLARY
Glucose-Capillary: 131 mg/dL — ABNORMAL HIGH (ref 70–99)
Glucose-Capillary: 143 mg/dL — ABNORMAL HIGH (ref 70–99)
Glucose-Capillary: 144 mg/dL — ABNORMAL HIGH (ref 70–99)
Glucose-Capillary: 146 mg/dL — ABNORMAL HIGH (ref 70–99)
Glucose-Capillary: 146 mg/dL — ABNORMAL HIGH (ref 70–99)
Glucose-Capillary: 150 mg/dL — ABNORMAL HIGH (ref 70–99)
Glucose-Capillary: 197 mg/dL — ABNORMAL HIGH (ref 70–99)
Glucose-Capillary: 208 mg/dL — ABNORMAL HIGH (ref 70–99)
Glucose-Capillary: 267 mg/dL — ABNORMAL HIGH (ref 70–99)
Glucose-Capillary: 361 mg/dL — ABNORMAL HIGH (ref 70–99)

## 2023-02-05 LAB — BASIC METABOLIC PANEL
Anion gap: 9 (ref 5–15)
BUN: 11 mg/dL (ref 6–20)
CO2: 20 mmol/L — ABNORMAL LOW (ref 22–32)
Calcium: 8.1 mg/dL — ABNORMAL LOW (ref 8.9–10.3)
Chloride: 105 mmol/L (ref 98–111)
Creatinine, Ser: 0.45 mg/dL — ABNORMAL LOW (ref 0.61–1.24)
GFR, Estimated: >60 mL/min (ref >60)
Glucose, Bld: 157 mg/dL — ABNORMAL HIGH (ref 70–99)
Potassium: 3.4 mmol/L — ABNORMAL LOW (ref 3.5–5.1)
Sodium: 134 mmol/L — ABNORMAL LOW (ref 135–145)

## 2023-02-05 LAB — COMPREHENSIVE METABOLIC PANEL
ALT: 15 U/L (ref 0–44)
AST: 12 U/L — ABNORMAL LOW (ref 15–41)
Albumin: 2.9 g/dL — ABNORMAL LOW (ref 3.5–5.0)
Alkaline Phosphatase: 60 U/L (ref 38–126)
Anion gap: 6 (ref 5–15)
BUN: 10 mg/dL (ref 6–20)
CO2: 23 mmol/L (ref 22–32)
Calcium: 8.2 mg/dL — ABNORMAL LOW (ref 8.9–10.3)
Chloride: 105 mmol/L (ref 98–111)
Creatinine, Ser: 0.57 mg/dL — ABNORMAL LOW (ref 0.61–1.24)
GFR, Estimated: >60 mL/min (ref >60)
Glucose, Bld: 171 mg/dL — ABNORMAL HIGH (ref 70–99)
Potassium: 3.3 mmol/L — ABNORMAL LOW (ref 3.5–5.1)
Sodium: 134 mmol/L — ABNORMAL LOW (ref 135–145)
Total Bilirubin: 0.5 mg/dL (ref 0.3–1.2)
Total Protein: 5.6 g/dL — ABNORMAL LOW (ref 6.5–8.1)

## 2023-02-05 LAB — CBC
HCT: 35.3 % — ABNORMAL LOW (ref 39.0–52.0)
Hemoglobin: 12 g/dL — ABNORMAL LOW (ref 13.0–17.0)
MCH: 27.8 pg (ref 26.0–34.0)
MCHC: 34 g/dL (ref 30.0–36.0)
MCV: 81.7 fL (ref 80.0–100.0)
Platelets: 156 10*3/uL (ref 150–400)
RBC: 4.32 MIL/uL (ref 4.22–5.81)
RDW: 13.2 % (ref 11.5–15.5)
WBC: 2.9 10*3/uL — ABNORMAL LOW (ref 4.0–10.5)
nRBC: 0 % (ref 0.0–0.2)

## 2023-02-05 LAB — BETA-HYDROXYBUTYRIC ACID: Beta-Hydroxybutyric Acid: 0.96 mmol/L — ABNORMAL HIGH (ref 0.05–0.27)

## 2023-02-05 LAB — HIV ANTIBODY (ROUTINE TESTING W REFLEX): HIV Screen 4th Generation wRfx: NONREACTIVE

## 2023-02-05 MED ORDER — INSULIN ASPART 100 UNIT/ML IJ SOLN
0.0000 [IU] | Freq: Every day | INTRAMUSCULAR | Status: DC
  Administered 2023-02-05: 3 [IU] via SUBCUTANEOUS

## 2023-02-05 MED ORDER — POTASSIUM CHLORIDE CRYS ER 20 MEQ PO TBCR
40.0000 meq | EXTENDED_RELEASE_TABLET | Freq: Two times a day (BID) | ORAL | Status: AC
  Administered 2023-02-05 (×2): 40 meq via ORAL
  Filled 2023-02-05 (×2): qty 2

## 2023-02-05 MED ORDER — INSULIN DETEMIR 100 UNIT/ML ~~LOC~~ SOLN
10.0000 [IU] | Freq: Two times a day (BID) | SUBCUTANEOUS | Status: DC
Start: 2023-02-05 — End: 2023-02-05

## 2023-02-05 MED ORDER — INSULIN DETEMIR 100 UNIT/ML ~~LOC~~ SOLN
10.0000 [IU] | Freq: Two times a day (BID) | SUBCUTANEOUS | Status: DC
  Administered 2023-02-05 – 2023-02-06 (×3): 10 [IU] via SUBCUTANEOUS
  Filled 2023-02-05 (×4): qty 0.1

## 2023-02-05 MED ORDER — POTASSIUM CHLORIDE 10 MEQ/100ML IV SOLN
10.0000 meq | Freq: Once | INTRAVENOUS | Status: AC
  Administered 2023-02-05: 10 meq via INTRAVENOUS
  Filled 2023-02-05: qty 100

## 2023-02-05 MED ORDER — INSULIN ASPART 100 UNIT/ML IJ SOLN
4.0000 [IU] | Freq: Three times a day (TID) | INTRAMUSCULAR | Status: DC
  Administered 2023-02-05 – 2023-02-06 (×4): 4 [IU] via SUBCUTANEOUS

## 2023-02-05 MED ORDER — INSULIN ASPART 100 UNIT/ML IJ SOLN
0.0000 [IU] | Freq: Three times a day (TID) | INTRAMUSCULAR | Status: DC
  Administered 2023-02-05: 3 [IU] via SUBCUTANEOUS
  Administered 2023-02-05: 5 [IU] via SUBCUTANEOUS
  Administered 2023-02-05 – 2023-02-06 (×2): 11 [IU] via SUBCUTANEOUS

## 2023-02-05 NOTE — Plan of Care (Signed)
Problem: Education: Goal: Ability to describe self-care measures that may prevent or decrease complications (Diabetes Survival Skills Education) will improve Outcome: Progressing   Problem: Coping: Goal: Ability to adjust to condition or change in health will improve Outcome: Progressing   Problem: Fluid Volume: Goal: Ability to maintain a balanced intake and output will improve Outcome: Progressing   Problem: Health Behavior/Discharge Planning: Goal: Ability to identify and utilize available resources and services will improve Outcome: Progressing Goal: Ability to manage health-related needs will improve Outcome: Progressing   Problem: Metabolic: Goal: Ability to maintain appropriate glucose levels will improve Outcome: Progressing   Problem: Nutritional: Goal: Maintenance of adequate nutrition will improve Outcome: Progressing Goal: Progress toward achieving an optimal weight will improve Outcome: Progressing   Problem: Skin Integrity: Goal: Risk for impaired skin integrity will decrease Outcome: Progressing   Problem: Tissue Perfusion: Goal: Adequacy of tissue perfusion will improve Outcome: Progressing   Problem: Education: Goal: Ability to describe self-care measures that may prevent or decrease complications (Diabetes Survival Skills Education) will improve Outcome: Progressing Goal: Individualized Educational Video(s) Outcome: Progressing   Problem: Cardiac: Goal: Ability to maintain an adequate cardiac output will improve Outcome: Progressing   Problem: Health Behavior/Discharge Planning: Goal: Ability to identify and utilize available resources and services will improve Outcome: Progressing Goal: Ability to manage health-related needs will improve Outcome: Progressing   Problem: Fluid Volume: Goal: Ability to achieve a balanced intake and output will improve Outcome: Progressing   Problem: Metabolic: Goal: Ability to maintain appropriate glucose  levels will improve Outcome: Progressing   Problem: Nutritional: Goal: Maintenance of adequate nutrition will improve Outcome: Progressing Goal: Maintenance of adequate weight for body size and type will improve Outcome: Progressing   Problem: Respiratory: Goal: Will regain and/or maintain adequate ventilation Outcome: Progressing   Problem: Urinary Elimination: Goal: Ability to achieve and maintain adequate renal perfusion and functioning will improve Outcome: Progressing   Problem: Education: Goal: Knowledge of General Education information will improve Description: Including pain rating scale, medication(s)/side effects and non-pharmacologic comfort measures Outcome: Progressing   Problem: Health Behavior/Discharge Planning: Goal: Ability to manage health-related needs will improve Outcome: Progressing   Problem: Clinical Measurements: Goal: Ability to maintain clinical measurements within normal limits will improve Outcome: Progressing Goal: Will remain free from infection Outcome: Progressing Goal: Diagnostic test results will improve Outcome: Progressing Goal: Respiratory complications will improve Outcome: Progressing Goal: Cardiovascular complication will be avoided Outcome: Progressing   Problem: Activity: Goal: Risk for activity intolerance will decrease Outcome: Progressing   Problem: Nutrition: Goal: Adequate nutrition will be maintained Outcome: Progressing   Problem: Coping: Goal: Level of anxiety will decrease Outcome: Progressing   Problem: Elimination: Goal: Will not experience complications related to bowel motility Outcome: Progressing Goal: Will not experience complications related to urinary retention Outcome: Progressing   Problem: Pain Managment: Goal: General experience of comfort will improve Outcome: Progressing   Problem: Safety: Goal: Ability to remain free from injury will improve Outcome: Progressing   Problem: Skin  Integrity: Goal: Risk for impaired skin integrity will decrease Outcome: Progressing

## 2023-02-05 NOTE — TOC Benefit Eligibility Note (Signed)
Patient Product/process development scientist completed.    The patient is insured through [employer]. Patient has [employer], may use a copay card, and/or apply for patient assistance if available.    Ran test claim for Lantus Pen and the current 30 day co-pay is $35.00.  Ran test claim for Novolog FlexPen and the current 30 day co-pay is $35.00.  This test claim was processed through [facility]- copay amounts may vary at other pharmacies due to pharmacy/plan contracts, or as the patient moves through the different stages of their insurance plan.     Roland Earl, CPHT Pharmacy Technician III Certified Patient Advocate [facility] Patient Advocate Team Direct Number: [phone]  Fax: [phone]

## 2023-02-05 NOTE — Progress Notes (Signed)
MEWS Progress Note  Patient Details Name: Mark Fry MRN: [id number] DOB: 1971/01/03 Today's Date: 02/05/2023   MEWS Flowsheet Documentation:  Assess: MEWS Score Temp: 97.9 F (36.6 C) BP: (!) 84/74 MAP (mmHg): 79 Pulse Rate: (!) 114 ECG Heart Rate: 94 Resp: 20 Level of Consciousness: Alert SpO2: 98 % O2 Device: Room Air Patient Activity (if Appropriate): In bed Assess: MEWS Score MEWS Temp: 0 MEWS Systolic: 1 MEWS Pulse: 2 MEWS RR: 0 MEWS LOC: 0 MEWS Score: 3 MEWS Score Color: Yellow Assess: SIRS CRITERIA SIRS Temperature : 0 SIRS Respirations : 0 SIRS Pulse: 1 SIRS WBC: 0 SIRS Score Sum : 1 Assess: if the MEWS score is Yellow or Red Were vital signs accurate and taken at a resting state?: Yes Does the patient meet 2 or more of the SIRS criteria?: No MEWS guidelines implemented : Yes, yellow Treat MEWS Interventions: Considered administering scheduled or prn medications/treatments as ordered Take Vital Signs Increase Vital Sign Frequency : Yellow: Q2hr x1, continue Q4hrs until patient remains green for 12hrs Escalate MEWS: Escalate: Yellow: Discuss with charge nurse and consider notifying provider and/or RRT Notify: Charge Nurse/RN Name of Charge Nurse/RN Notified: Hilton Cork RN      Nonda Lou 02/05/2023, 1:02 PM

## 2023-02-05 NOTE — Inpatient Diabetes Management (Addendum)
Inpatient Diabetes Program Recommendations  AACE/ADA: New Consensus Statement on Inpatient Glycemic Control (2015)  Target Ranges:  Prepandial:   less than 140 mg/dL      Peak postprandial:   less than 180 mg/dL (1-2 hours)      Critically ill patients:  140 - 180 mg/dL   Lab Results  Component Value Date   GLUCAP 208 (H) 02/05/2023   HGBA1C >15.5 (H) 05/17/2021    Review of Glycemic Control  Diabetes history: DM2 Outpatient Diabetes medications: Levemir 22 units BID, Novolog 3 units TID Current orders for Inpatient glycemic control: IV insulin per EndoTool for DKA, transitioned to Levemir 10 BID, Novolog 0-15 TID with meals and 0-5 HS + 4 units TID  HgbA1C - > 15.5%  Inpatient Diabetes Program Recommendations:    Needs f/u with PCP regarding HgbA1C of > 15.5%. Inpatient Diabetes Coordinators spoke with pt approx 1.5 years ago when inpatient. Per Pharmacy team, pt's copay on Basaglar pens and Novolog pens are $35 for each set of 5. Will see pt this am and speak with him about importance of taking insulin as prescribed, monitoring blood sugars daily, eating healthy, exercising and f/u with PCP on a regular basis. Will discuss hypoglycemia s/s and treatment. Pt is likely not taking insulin with HgbA1C of > 15.5%.  See pt this am.  Thank you. Ailene Ards, RD, LDN, CDCES Inpatient Diabetes Coordinator [phone]  Addendum: Spoke with pt at bedside regarding his diabetes, HgbA1C of > 15.5%. Pt states he hasn't been taking his insulin as prescribed. States he hasn't wanted to take insulin at work, but now knows he needs to. Does not have issues with getting his insulin. Needs closer f/u with PCP. Pt has been losing weight, talked about how uncontrolled blood sugars cause weight loss, as body needs insulin to move glucose from bloodstream to the body's cells to make energy. Pt voices understanding. Encouraged pt to get serious about controlling his blood sugars to prevent  complications. Pt appreciative of visit and states he will now take insulin as prescribed.  Monitors blood sugars 2-3x/day. Both Lantus and Novolog pens are $35 copays.   Continue to follow.  Thank you. Ailene Ards, RD, LDN, CDCES Inpatient Diabetes Coordinator [phone]

## 2023-02-05 NOTE — Progress Notes (Signed)
[facility] PROGRESS NOTE    Progress Note  Mark Fry  ZOX:[id number] DOB: 1970/07/29 DOA: 02/04/2023 PCP: Jeani Sow, MD     Brief Narrative:   Mark Fry is an 52 y.o. male past medical history significant for diabetes mellitus type 2 comes into the emergency room with hyperglycemia polyuria polydipsia after being noncompliant with his insulin.   Assessment/Plan:   DKA (diabetic ketoacidosis) (HCC): Likely due to noncompliance with his medication he just did not feel like it. Started on IV fluids and IV insulin.  Anion gap has closed, bicarb is greater than 20. Transition to long-acting insulin plus sliding scale discontinue IV insulin.  Hypokalemia: Replete orally recheck in the morning.  Essential hypertension: Blood pressure is well-controlled he is on no antihypertensive medication at home. Might benefit on ACE inhibitor upon discharge.  Hyperbilirubinemia: Likely due to nausea and vomiting resolved with IV fluid resuscitation.  Leukopenia: Low again will need further evaluation as an outpatient patient with heme hematology. Patient is currently asymptomatic. He was low to back in 2021 as low as 2.7.   DVT prophylaxis: lovenox Family Communication:none Status is: Inpatient Remains inpatient appropriate because: DKA    Code Status:     Code Status Orders  (From admission, onward)           Start     Ordered   02/04/23 1521  Full code  Continuous       Question:  By:  Answer:  Consent: discussion documented in EHR   02/04/23 1521           Code Status History     Date Active Date Inactive Code Status Order ID Comments User Context   05/17/2021 0628 05/20/2021 1610 Full Code [id number]  Hershel Giovanni, MD ED   09/04/2019 1212 09/06/2019 2044 Full Code [id number]  Glade Lloyd, MD ED   08/14/2014 2241 08/16/2014 1935 Full Code [id number]  Pearson Grippe, MD Inpatient         IV Access:   Peripheral IV   Procedures and  diagnostic studies:   No results found.   Medical Consultants:   None.   Subjective:    Mark Fry relates he feels great no cuts or sores he was just noncompliant with his medication.  Objective:    Vitals:   02/05/23 0200 02/05/23 0300 02/05/23 0400 02/05/23 0500  BP: 135/83 134/83 (!) 117/92 (!) 129/90  Pulse: 77 88 84 72  Resp: 14 16 16 12   Temp:      TempSrc:      SpO2: 100% 100% 100% 100%  Weight:      Height:       SpO2: 100 %   Intake/Output Summary (Last 24 hours) at 02/05/2023 0635 Last data filed at 02/05/2023 0400 Gross per 24 hour  Intake 3820.48 ml  Output 200 ml  Net 3620.48 ml   Filed Weights   02/04/23 1455 02/04/23 1834  Weight: 61 kg 57.5 kg    Exam: General exam: In no acute distress. Respiratory system: Good air movement and clear to auscultation. Cardiovascular system: S1 & S2 heard, RRR. No JVD. Gastrointestinal system: Abdomen is nondistended, soft and nontender.  Extremities: No pedal edema. Skin: No rashes, lesions or ulcers Psychiatry: Judgement and insight appear normal. Mood & affect appropriate.    Data Reviewed:    Labs: Basic Metabolic Panel: Recent Labs  Lab 02/04/23 1050 02/04/23 1547 02/04/23 1947 02/04/23 2357 02/05/23 0518  NA 135 135 133* 134* 134*  K 4.5 4.9 3.7 3.4* 3.3*  CL 101 105 106 105 105  CO2 11* 13* 18* 20* 23  GLUCOSE 388* 293* 225* 157* 171*  BUN 18 16 13 11 10   CREATININE 0.98 0.88 0.67 0.45* 0.57*  CALCIUM 9.3 8.5* 8.1* 8.1* 8.2*   GFR Estimated Creatinine Clearance: 89.8 mL/min (A) (by C-G formula based on SCr of 0.57 mg/dL (L)). Liver Function Tests: Recent Labs  Lab 02/04/23 1050 02/05/23 0518  AST 19 12*  ALT 20 15  ALKPHOS 119 60  BILITOT 1.6* 0.5  PROT 7.9 5.6*  ALBUMIN 4.1 2.9*   No results for input(s): "LIPASE", "AMYLASE" in the last 168 hours. No results for input(s): "AMMONIA" in the last 168 hours. Coagulation profile No results for input(s): "INR", "PROTIME"  in the last 168 hours. COVID-19 Labs  No results for input(s): "DDIMER", "FERRITIN", "LDH", "CRP" in the last 72 hours.  Lab Results  Component Value Date   SARSCOV2NAA NEGATIVE 05/16/2021   SARSCOV2NAA NEGATIVE 09/04/2019    CBC: Recent Labs  Lab 02/04/23 1050 02/05/23 0518  WBC 3.6* 2.9*  NEUTROABS 2.5  --   HGB 15.0 12.0*  HCT 46.4 35.3*  MCV 84.7 81.7  PLT 197 156   Cardiac Enzymes: No results for input(s): "CKTOTAL", "CKMB", "CKMBINDEX", "TROPONINI" in the last 168 hours. BNP (last 3 results) No results for input(s): "PROBNP" in the last 8760 hours. CBG: Recent Labs  Lab 02/04/23 2314 02/05/23 0012 02/05/23 0129 02/05/23 0232 02/05/23 0432  GLUCAP 134* 144* 146* 150* 146*   D-Dimer: No results for input(s): "DDIMER" in the last 72 hours. Hgb A1c: No results for input(s): "HGBA1C" in the last 72 hours. Lipid Profile: No results for input(s): "CHOL", "HDL", "LDLCALC", "TRIG", "CHOLHDL", "LDLDIRECT" in the last 72 hours. Thyroid function studies: No results for input(s): "TSH", "T4TOTAL", "T3FREE", "THYROIDAB" in the last 72 hours.  Invalid input(s): "FREET3" Anemia work up: No results for input(s): "VITAMINB12", "FOLATE", "FERRITIN", "TIBC", "IRON", "RETICCTPCT" in the last 72 hours. Sepsis Labs: Recent Labs  Lab 02/04/23 1050 02/04/23 1115 02/05/23 0518  WBC 3.6*  --  2.9*  LATICACIDVEN  --  1.7  --    Microbiology Recent Results (from the past 240 hour(s))  MRSA Next Gen by PCR, Nasal     Status: None   Collection Time: 02/04/23  6:49 PM   Specimen: Nasal Mucosa; Nasal Swab  Result Value Ref Range Status   MRSA by PCR Next Gen NOT DETECTED NOT DETECTED Final    Comment: (NOTE) The GeneXpert MRSA Assay (FDA approved for NASAL specimens only), is one component of a comprehensive MRSA colonization surveillance program. It is not intended to diagnose MRSA infection nor to guide or monitor treatment for MRSA infections. Test performance is not FDA  approved in patients less than 54 years old. Performed at [facility], [address], [place], [place] [address]      Medications:    Chlorhexidine Gluconate Cloth  6 each Topical Daily   enoxaparin (LOVENOX) injection  40 mg Subcutaneous Q24H   Continuous Infusions:  dextrose 5% lactated ringers 125 mL/hr at 02/05/23 0400   insulin 1.5 Units/hr (02/05/23 0400)   lactated ringers Stopped (02/04/23 2320)      LOS: 1 day   Marinda Elk  [facility]  02/05/2023, 6:35 AM

## 2023-02-06 DIAGNOSIS — E876 Hypokalemia: Secondary | ICD-10-CM

## 2023-02-06 DIAGNOSIS — D72810 Lymphocytopenia: Secondary | ICD-10-CM

## 2023-02-06 DIAGNOSIS — E1110 Type 2 diabetes mellitus with ketoacidosis without coma: Secondary | ICD-10-CM | POA: Diagnosis not present

## 2023-02-06 LAB — BASIC METABOLIC PANEL
Anion gap: 8 (ref 5–15)
BUN: 13 mg/dL (ref 6–20)
CO2: 22 mmol/L (ref 22–32)
Calcium: 8.3 mg/dL — ABNORMAL LOW (ref 8.9–10.3)
Chloride: 105 mmol/L (ref 98–111)
Creatinine, Ser: 0.44 mg/dL — ABNORMAL LOW (ref 0.61–1.24)
GFR, Estimated: >60 mL/min (ref >60)
Glucose, Bld: 276 mg/dL — ABNORMAL HIGH (ref 70–99)
Potassium: 3.4 mmol/L — ABNORMAL LOW (ref 3.5–5.1)
Sodium: 135 mmol/L (ref 135–145)

## 2023-02-06 LAB — HEMOGLOBIN A1C
Hgb A1c MFr Bld: >15.5 % — ABNORMAL HIGH (ref 4.8–5.6)
Mean Plasma Glucose: >398 mg/dL

## 2023-02-06 LAB — GLUCOSE, CAPILLARY
Glucose-Capillary: 332 mg/dL — ABNORMAL HIGH (ref 70–99)
Glucose-Capillary: 216 mg/dL — ABNORMAL HIGH (ref 70–99)

## 2023-02-06 MED ORDER — INSULIN ASPART 100 UNIT/ML FLEXPEN: 6.0000 [IU] | PEN_INJECTOR | Freq: Three times a day (TID) | SUBCUTANEOUS | 11 refills | Status: AC

## 2023-02-06 MED ORDER — "PEN NEEDLES 3/16"" 31G X 5 MM MISC": 3 refills | Status: AC

## 2023-02-06 MED ORDER — LANCETS MISC. MISC
1.0000 | Freq: Three times a day (TID) | 0 refills | Status: AC
Start: 2023-02-06 — End: 2023-03-08

## 2023-02-06 MED ORDER — POTASSIUM CHLORIDE CRYS ER 20 MEQ PO TBCR
40.0000 meq | EXTENDED_RELEASE_TABLET | ORAL | Status: DC
  Administered 2023-02-06: 40 meq via ORAL
  Filled 2023-02-06: qty 2

## 2023-02-06 MED ORDER — BLOOD GLUCOSE TEST VI STRP
1.0000 | ORAL_STRIP | Freq: Three times a day (TID) | 0 refills | Status: AC
Start: 2023-02-06 — End: 2023-03-08

## 2023-02-06 MED ORDER — INSULIN DETEMIR 100 UNIT/ML FLEXPEN: 30.0000 [IU] | PEN_INJECTOR | Freq: Every day | SUBCUTANEOUS | 11 refills | Status: DC

## 2023-02-06 MED ORDER — LANCET DEVICE MISC
1.0000 | Freq: Three times a day (TID) | 0 refills | Status: AC
Start: 2023-02-06 — End: 2023-03-08

## 2023-02-06 MED ORDER — BLOOD GLUCOSE MONITORING SUPPL DEVI: 1.0000 | Freq: Three times a day (TID) | 0 refills | Status: DC

## 2023-02-06 NOTE — Discharge Summary (Signed)
Physician Discharge Summary  Mark Fry [id number] DOB: 09-Dec-1970 DOA: 02/04/2023  PCP: Jeani Sow, MD  Admit date: 02/04/2023 Discharge date: 02/06/2023  Admitted From: Home Disposition:  Home  Recommendations for Outpatient Follow-up:  Follow up with PCP in 1-2 weeks, follow-up on blood pressure and start antihypertensive medications as tolerated, will benefit from an ACE inhibitor.  Please obtain BMP/CBC in one week   Home Health:No Equipment/Devices:None  Discharge Condition:Stable CODE STATUS:Full Diet recommendation: Heart Healthy  Brief/Interim Summary: 52 y.o. male past medical history significant for diabetes mellitus type 2 comes into the emergency room with hyperglycemia polyuria polydipsia after being noncompliant with his insulin.   Discharge Diagnoses:  Principal Problem:   DKA (diabetic ketoacidosis) (HCC) Active Problems:   Aortic atherosclerosis (HCC)   Hypertension   Hyperlipidemia   Hyperbilirubinemia   Leukopenia  Diabetes mellitus type 2/DKA: Likely due to noncompliance with his medication uses did not feel like taking it at home. He was started on IV fluids IV insulin gap closed bicarb again greater than 20. He was changed to long-acting insulin plus sliding scale his long-acting insulin was increased to 30 units and will continue NovoLog with meals.   Hypokalemia: It was repleted now resolved.  Essential hypertension: Blood pressure is well-controlled currently on no antihypertensive medication. Will need to follow-up with PCP as an outpatient, recommend starting an ACE inhibitor low-dose as an outpatient if blood pressure can tolerate it.  Hyperbilirubinemia: He did not nausea and vomiting now resolved.  Leukopenia: Will need further evaluation by his PCP will repeat a CBC as an outpatient.  Patient is currently asymptomatic, this has been going on for a while back in 2021 it was low 2.7 in the hospital 2.9.   Discharge  Instructions  Discharge Instructions     Diet - low sodium heart healthy   Complete by: As directed    Increase activity slowly   Complete by: As directed       Allergies as of 02/06/2023   No Known Allergies      Medication List     STOP taking these medications    polyethylene glycol 17 g packet Commonly known as: MIRALAX / GLYCOLAX       TAKE these medications    Blood Glucose Monitoring Suppl Devi 1 each by Does not apply route in the morning, at noon, and at bedtime. May substitute to any manufacturer covered by patient's insurance.   BLOOD GLUCOSE TEST STRIPS Strp 1 each by In Vitro route in the morning, at noon, and at bedtime. May substitute to any manufacturer covered by patient's insurance.   insulin aspart 100 UNIT/ML FlexPen Commonly known as: NOVOLOG Inject 6 Units into the skin 3 (three) times daily with meals. What changed: how much to take   insulin detemir 100 UNIT/ML FlexPen Commonly known as: LEVEMIR Inject 30 Units into the skin daily.   Lancet Device Misc 1 each by Does not apply route in the morning, at noon, and at bedtime. May substitute to any manufacturer covered by patient's insurance.   Lancets Misc. Misc 1 each by Does not apply route in the morning, at noon, and at bedtime. May substitute to any manufacturer covered by patient's insurance.   Pen Needles 3/16" 31G X 5 MM Misc Take Levemir at bedtime, NovoLog at mealtimes,  as directed        No Known Allergies  Consultations: None   Procedures/Studies: No results found.   Subjective: No complaints  Discharge Exam:  Vitals:   02/06/23 0231 02/06/23 0604  BP: 112/80 106/83  Pulse: 84 92  Resp: 14 16  Temp: 97.9 F (36.6 C) (!) 97.3 F (36.3 C)  SpO2: 99% 99%   Vitals:   02/05/23 1812 02/05/23 2207 02/06/23 0231 02/06/23 0604  BP: 114/81 94/67 112/80 106/83  Pulse: 97 95 84 92  Resp: 20 16 14 16   Temp: 98.7 F (37.1 C) 98.9 F (37.2 C) 97.9 F (36.6 C)  (!) 97.3 F (36.3 C)  TempSrc: Oral Oral Oral Oral  SpO2: 99% 99% 99% 99%  Weight:      Height:        General: Pt is alert, awake, not in acute distress Cardiovascular: RRR, S1/S2 +, no rubs, no gallops Respiratory: CTA bilaterally, no wheezing, no rhonchi Abdominal: Soft, NT, ND, bowel sounds + Extremities: no edema, no cyanosis    The results of significant diagnostics from this hospitalization (including imaging, microbiology, ancillary and laboratory) are listed below for reference.     Microbiology: Recent Results (from the past 240 hour(s))  MRSA Next Gen by PCR, Nasal     Status: None   Collection Time: 02/04/23  6:49 PM   Specimen: Nasal Mucosa; Nasal Swab  Result Value Ref Range Status   MRSA by PCR Next Gen NOT DETECTED NOT DETECTED Final    Comment: (NOTE) The GeneXpert MRSA Assay (FDA approved for NASAL specimens only), is one component of a comprehensive MRSA colonization surveillance program. It is not intended to diagnose MRSA infection nor to guide or monitor treatment for MRSA infections. Test performance is not FDA approved in patients less than 83 years old. Performed at [facility], [address] [place], [place] [address]      Labs: BNP (last 3 results) No results for input(s): "BNP" in the last 8760 hours. Basic Metabolic Panel: Recent Labs  Lab 02/04/23 1547 02/04/23 1947 02/04/23 2357 02/05/23 0518 02/06/23 0534  NA 135 133* 134* 134* 135  K 4.9 3.7 3.4* 3.3* 3.4*  CL 105 106 105 105 105  CO2 13* 18* 20* 23 22  GLUCOSE 293* 225* 157* 171* 276*  BUN 16 13 11 10 13   CREATININE 0.88 0.67 0.45* 0.57* 0.44*  CALCIUM 8.5* 8.1* 8.1* 8.2* 8.3*   Liver Function Tests: Recent Labs  Lab 02/04/23 1050 02/05/23 0518  AST 19 12*  ALT 20 15  ALKPHOS 119 60  BILITOT 1.6* 0.5  PROT 7.9 5.6*  ALBUMIN 4.1 2.9*   No results for input(s): "LIPASE", "AMYLASE" in the last 168 hours. No results for input(s): "AMMONIA" in the  last 168 hours. CBC: Recent Labs  Lab 02/04/23 1050 02/05/23 0518  WBC 3.6* 2.9*  NEUTROABS 2.5  --   HGB 15.0 12.0*  HCT 46.4 35.3*  MCV 84.7 81.7  PLT 197 156   Cardiac Enzymes: No results for input(s): "CKTOTAL", "CKMB", "CKMBINDEX", "TROPONINI" in the last 168 hours. BNP: Invalid input(s): "POCBNP" CBG: Recent Labs  Lab 02/05/23 0912 02/05/23 1210 02/05/23 1859 02/05/23 2136 02/06/23 0759  GLUCAP 208* 197* 361* 267* 332*   D-Dimer No results for input(s): "DDIMER" in the last 72 hours. Hgb A1c No results for input(s): "HGBA1C" in the last 72 hours. Lipid Profile No results for input(s): "CHOL", "HDL", "LDLCALC", "TRIG", "CHOLHDL", "LDLDIRECT" in the last 72 hours. Thyroid function studies No results for input(s): "TSH", "T4TOTAL", "T3FREE", "THYROIDAB" in the last 72 hours.  Invalid input(s): "FREET3" Anemia work up No results for input(s): "VITAMINB12", "FOLATE", "FERRITIN", "  TIBC", "IRON", "RETICCTPCT" in the last 72 hours. Urinalysis    Component Value Date/Time   COLORURINE STRAW (A) 02/04/2023 1101   APPEARANCEUR CLEAR 02/04/2023 1101   LABSPEC 1.029 02/04/2023 1101   PHURINE 5.0 02/04/2023 1101   GLUCOSEU >=500 (A) 02/04/2023 1101   GLUCOSEU >=1000 (A) 06/10/2022 1637   HGBUR SMALL (A) 02/04/2023 1101   BILIRUBINUR NEGATIVE 02/04/2023 1101   KETONESUR 80 (A) 02/04/2023 1101   PROTEINUR 30 (A) 02/04/2023 1101   UROBILINOGEN 0.2 06/10/2022 1637   NITRITE NEGATIVE 02/04/2023 1101   LEUKOCYTESUR NEGATIVE 02/04/2023 1101   Sepsis Labs Recent Labs  Lab 02/04/23 1050 02/05/23 0518  WBC 3.6* 2.9*   Microbiology Recent Results (from the past 240 hour(s))  MRSA Next Gen by PCR, Nasal     Status: None   Collection Time: 02/04/23  6:49 PM   Specimen: Nasal Mucosa; Nasal Swab  Result Value Ref Range Status   MRSA by PCR Next Gen NOT DETECTED NOT DETECTED Final    Comment: (NOTE) The GeneXpert MRSA Assay (FDA approved for NASAL specimens only), is  one component of a comprehensive MRSA colonization surveillance program. It is not intended to diagnose MRSA infection nor to guide or monitor treatment for MRSA infections. Test performance is not FDA approved in patients less than 25 years old. Performed at [facility], [address], [place], [place] [address]      Time coordinating discharge: Over 35 minutes  SIGNED:   Marinda Elk, MD  [facility] 02/06/2023, 10:36 AM Pager   If 7PM-7AM, please contact night-coverage [email or web address] Password [email or web address]

## 2023-02-06 NOTE — Inpatient Diabetes Management (Signed)
Inpatient Diabetes Program Recommendations  AACE/ADA: New Consensus Statement on Inpatient Glycemic Control (2015)  Target Ranges:  Prepandial:   less than 140 mg/dL      Peak postprandial:   less than 180 mg/dL (1-2 hours)      Critically ill patients:  140 - 180 mg/dL    Latest Reference Range & Units 02/05/23 04:32 02/05/23 06:40 02/05/23 07:49 02/05/23 09:12 02/05/23 12:10 02/05/23 18:59 02/05/23 21:36  Glucose-Capillary 70 - 99 mg/dL 696 (H)  IV Insulin Drip Running 143 (H)  10 units Levemir @0714  131 (H) 208 (H)  9 units Novolog  IV Insulin Drip Stopped @0938   197 (H)  7 units Novolog  361 (H)  15 units Novolog  267 (H)  3 units Novolog  10 units Levemir  (H): Data is abnormally high  Latest Reference Range & Units 02/06/23 07:59  Glucose-Capillary 70 - 99 mg/dL 295 (H)  (H): Data is abnormally high   Home DM Meds: Novolog 3 units TID        Levemir 22 units at HS  Current Orders: Levemir 10 units BID      Novolog Moderate Correction Scale/ SSI (0-15 units) TID AC + HS      Novolog 4 units TID with meals    Transitioned to SQ Insulin yest AM  DM Coordinator spoke w/ pt yesterday    MD- Note CBG 332 this AM.  Please consider:  1. Increase Levemir to 15 units BID  2. Increase Novolog Meal Coverage to 6 units TID with meals  Please switch pt to Lantus and Novolog at time of Discharge Lantus and Novolog insulin Pens are $35 each co-pay    --Will follow patient during hospitalization--  Ambrose Finland RN, MSN, CDCES Diabetes Coordinator Inpatient Glycemic Control Team Team Pager: [phone] (8a-5p)

## 2023-02-07 ENCOUNTER — Telehealth: Payer: Self-pay

## 2023-02-07 NOTE — Transitions of Care (Post Inpatient/ED Visit) (Signed)
02/07/2023  Name: Mark Fry MRN: [id number] DOB: 1971-05-12  Today's TOC FU Call Status: Today's TOC FU Call Status:: Successful TOC FU Call Completed TOC FU Call Complete Date: 02/07/23 Patient's Name and Date of Birth confirmed.  Transition Care Management Follow-up Telephone Call Date of Discharge: 02/06/23 Discharge Facility: [facility]) Type of Discharge: Inpatient Admission Primary Inpatient Discharge Diagnosis:: ketoacidosis How have you been since you were released from the hospital?: Better Any questions or concerns?: No  Items Reviewed: Did you receive and understand the discharge instructions provided?: Yes Medications obtained,verified, and reconciled?: Yes (Medications Reviewed) Any new allergies since your discharge?: No Dietary orders reviewed?: Yes Do you have support at home?: Yes People in Home: sibling(s)  Medications Reviewed Today: Medications Reviewed Today     Reviewed by Karena Addison, LPN (Licensed Practical Nurse) on 02/07/23 at 1002  Med List Status: <None>   Medication Order Taking? Sig Documenting Provider Last Dose Status Informant  Blood Glucose Monitoring Suppl DEVI [id number]  1 each by Does not apply route in the morning, at noon, and at bedtime. May substitute to any manufacturer covered by patient's insurance. Marinda Elk, MD  Active   Glucose Blood (BLOOD GLUCOSE TEST STRIPS) STRP [id number]  1 each by In Vitro route in the morning, at noon, and at bedtime. May substitute to any manufacturer covered by patient's insurance. Marinda Elk, MD  Active   insulin aspart (NOVOLOG) 100 UNIT/ML FlexPen [id number]  Inject 6 Units into the skin 3 (three) times daily with meals. Marinda Elk, MD  Active   insulin detemir (LEVEMIR) 100 UNIT/ML FlexPen [id number]  Inject 30 Units into the skin daily. Marinda Elk, MD  Active   Insulin Pen Needle (PEN NEEDLES 3/16") 31G X 5 MM MISC [id number]  Take Levemir at bedtime,  NovoLog at mealtimes,  as directed Marinda Elk, MD  Active   Lancet Device MISC [id number]  1 each by Does not apply route in the morning, at noon, and at bedtime. May substitute to any manufacturer covered by patient's insurance. Marinda Elk, MD  Active   Lancets Misc. MISC [id number]  1 each by Does not apply route in the morning, at noon, and at bedtime. May substitute to any manufacturer covered by patient's insurance. Marinda Elk, MD  Active             Home Care and Equipment/Supplies: Were Home Health Services Ordered?: NA Any new equipment or medical supplies ordered?: NA  Functional Questionnaire: Do you need assistance with bathing/showering or dressing?: No Do you need assistance with meal preparation?: No Do you need assistance with eating?: No Do you have difficulty maintaining continence: No Do you need assistance with getting out of bed/getting out of a chair/moving?: No Do you have difficulty managing or taking your medications?: No  Follow up appointments reviewed: PCP Follow-up appointment confirmed?: No (patient needs a Mon or Tue appt, sent message to staff to schedule) Specialist Hospital Follow-up appointment confirmed?: NA Do you need transportation to your follow-up appointment?: No Do you understand care options if your condition(s) worsen?: Yes-patient verbalized understanding    SIGNATURE Karena Addison, LPN [facility] Nurse Health Advisor Direct Dial [phone]

## 2023-02-10 ENCOUNTER — Telehealth: Payer: Self-pay

## 2023-02-10 NOTE — Transitions of Care (Post Inpatient/ED Visit) (Signed)
02/10/2023  Name: Mark Fry MRN: [id number] DOB: October 19, 1970  Today's TOC FU Call Status: Today's TOC FU Call Status:: Unsuccessful Call (1st Attempt) Unsuccessful Call (1st Attempt) Date: 02/10/23  Attempted to reach the patient regarding the most recent Inpatient/ED visit.Unsuccessful outreach attempt to engage pt for 30 day TOC program due to high risk for readmission and/or high utilizer.   Follow Up Plan: Additional outreach attempts will be made to reach the patient to complete the Transitions of Care (Post Inpatient/ED visit) call.     Antionette Fairy, RN,BSN,CCM RN Care Manager  Transitions of Care  [facility]  Direct Phone: [phone] Toll Free: [phone] Fax: [phone]

## 2023-02-11 ENCOUNTER — Telehealth: Payer: Self-pay

## 2023-02-11 NOTE — Transitions of Care (Post Inpatient/ED Visit) (Signed)
02/11/2023  Name: Mark Fry MRN: [id number] DOB: 05-23-1970  Today's TOC FU Call Status: Today's TOC FU Call Status:: Successful TOC FU Call Completed TOC FU Call Complete Date: 02/11/23 Patient's Name and Date of Birth confirmed.  Transition Care Management Follow-up Telephone Call Date of Discharge: 01/06/23 Discharge Facility: [facility]) Type of Discharge: Inpatient Admission Primary Inpatient Discharge Diagnosis:: "DKA" How have you been since you were released from the hospital?: Better (Pt voices he is doing well-currently still in bed resting at present-states he plans to reurn to work on tomorrow-appetite good-watching what he eats-he has not checked cbgs yet) Any questions or concerns?: No  Items Reviewed: Did you receive and understand the discharge instructions provided?: Yes Medications obtained,verified, and reconciled?: Yes (Medications Reviewed) (pt voices that he did not pick up his insulins until yesterday as pharmacy was out of stock of meds) Any new allergies since your discharge?: No Dietary orders reviewed?: Yes Type of Diet Ordered:: low salt/heart healthy/carb modified Do you have support at home?: Yes People in Home: alone Name of Support/Comfort Primary Source: pt voices he has family nearby if needed  Medications Reviewed Today: Medications Reviewed Today     Reviewed by Charlyn Minerva, RN (Registered Nurse) on 02/11/23 at 1104  Med List Status: <None>   Medication Order Taking? Sig Documenting Provider Last Dose Status Informant  Blood Glucose Monitoring Suppl DEVI [id number] Yes 1 each by Does not apply route in the morning, at noon, and at bedtime. May substitute to any manufacturer covered by patient's insurance. Marinda Elk, MD Taking Active   Glucose Blood (BLOOD GLUCOSE TEST STRIPS) STRP [id number] Yes 1 each by In Vitro route in the morning, at noon, and at bedtime. May substitute to any manufacturer covered by  patient's insurance. Marinda Elk, MD Taking Active   insulin aspart (NOVOLOG) 100 UNIT/ML FlexPen [id number] Yes Inject 6 Units into the skin 3 (three) times daily with meals. Marinda Elk, MD Taking Active   insulin detemir (LEVEMIR) 100 UNIT/ML FlexPen [id number] Yes Inject 30 Units into the skin daily. Marinda Elk, MD Taking Active   Insulin Pen Needle (PEN NEEDLES 3/16") 31G X 5 MM MISC [id number] Yes Take Levemir at bedtime, NovoLog at mealtimes,  as directed Marinda Elk, MD Taking Active   Lancet Device MISC [id number] Yes 1 each by Does not apply route in the morning, at noon, and at bedtime. May substitute to any manufacturer covered by patient's insurance. Marinda Elk, MD Taking Active   Lancets Misc. MISC [id number] Yes 1 each by Does not apply route in the morning, at noon, and at bedtime. May substitute to any manufacturer covered by patient's insurance. Marinda Elk, MD Taking Active             Home Care and Equipment/Supplies: Were Home Health Services Ordered?: NA Any new equipment or medical supplies ordered?: NA  Functional Questionnaire: Do you need assistance with bathing/showering or dressing?: No Do you need assistance with meal preparation?: No Do you need assistance with eating?: No Do you have difficulty maintaining continence: No Do you need assistance with getting out of bed/getting out of a chair/moving?: No Do you have difficulty managing or taking your medications?: No  Follow up appointments reviewed: PCP Follow-up appointment confirmed?: Yes Date of PCP follow-up appointment?: 02/18/23 (pt had been waiting to hear back from office regarding appt-care guide assisted with making appt during this call) Follow-up Provider: [facility] Follow-up  appointment confirmed?: NA Do you need transportation to your follow-up appointment?: No (pt confirms he is able to drive himself to appts) Do you  understand care options if your condition(s) worsen?: Yes-patient verbalized understanding  SDOH Interventions Today    Flowsheet Row Most Recent Value  SDOH Interventions   Food Insecurity Interventions Intervention Not Indicated  Housing Interventions Intervention Not Indicated  Transportation Interventions Intervention Not Indicated       Goals Addressed             This Visit's Progress    TOC Care Plan       Current Barriers:  Chronic Disease Management support and education needs related to HTN and DMII   RNCM Clinical Goal(s):  Patient will work with the Care Management team over the next 30 days to address Transition of Care Barriers: ongoing education for chronic conditions take all medications exactly as prescribed and will call provider for medication related questions as evidenced by compliance with insulin therapy attend all scheduled medical appointments:   as evidenced by completion of PCP f/u appt demonstrate Improved health management independence as evidenced by monitoring cbgs and BP in the home  through collaboration with RN Care manager, provider, and care team.   Interventions: Evaluation of current treatment plan related to  self management and patient's adherence to plan as established by provider   Diabetes Interventions:  (Status:  New goal.) Short Term Goal Assessed patient's understanding of A1c goal: <7% Provided education to patient about basic DM disease process Reviewed medications with patient and discussed importance of medication adherence Counseled on importance of regular laboratory monitoring as prescribed Discussed plans with patient for ongoing care management follow up and provided patient with direct contact information for care management team Advised patient, providing education and rationale, to check cbg in the home as ordered(3-4x/day) and record, calling provider for findings outside established parameters Review of patient  status, including review of consultants reports, relevant laboratory and other test results, and medications completed Assessed social determinant of health barriers Lab Results  Component Value Date   HGBA1C >15.5 (H) 02/04/2023  -Care guide assisted with making PCP f/u appt during call  Patient Goals/Self-Care Activities: Participate in Transition of Care Program/Attend TOC scheduled calls Take all medications as prescribed Attend all scheduled provider appointments Call provider office for new concerns or questions  check blood sugar at prescribed times: before meals and at bedtime take the blood sugar log to all doctor visits limit fast food meals to no more than 1 per week Obtain BP monitor to check BP in the home  Follow Up Plan:  Telephone follow up appointment with care management team member scheduled for:  next week The patient has been provided with contact information for the care management team and has been advised to call with any health related questions or concerns.           Antionette Fairy, RN,BSN,CCM Care Management Community Coordinator  Transitions of Care  [facility] - Population Health  Direct Phone: [phone] Toll Free: [phone] Fax: [phone]

## 2023-02-11 NOTE — Patient Instructions (Signed)
Visit Information  Thank you for taking time to visit with me today. Please don't hesitate to contact me if I can be of assistance to you before our next scheduled telephone appointment.  Our next appointment is by telephone on 02/18/23 at 11am  Following is a copy of your care plan:   Goals Addressed             This Visit's Progress    TOC Care Plan       Current Barriers:  Chronic Disease Management support and education needs related to HTN and DMII   RNCM Clinical Goal(s):  Patient will work with the Care Management team over the next 30 days to address Transition of Care Barriers: ongoing education for chronic conditions take all medications exactly as prescribed and will call provider for medication related questions as evidenced by compliance with insulin therapy attend all scheduled medical appointments:   as evidenced by completion of PCP f/u appt demonstrate Improved health management independence as evidenced by monitoring cbgs and BP in the home  through collaboration with RN Care manager, provider, and care team.   Interventions: Evaluation of current treatment plan related to  self management and patient's adherence to plan as established by provider   Diabetes Interventions:  (Status:  New goal.) Short Term Goal Assessed patient's understanding of A1c goal: <7% Provided education to patient about basic DM disease process Reviewed medications with patient and discussed importance of medication adherence Counseled on importance of regular laboratory monitoring as prescribed Discussed plans with patient for ongoing care management follow up and provided patient with direct contact information for care management team Advised patient, providing education and rationale, to check cbg in the home as ordered(3-4x/day) and record, calling provider for findings outside established parameters Review of patient status, including review of consultants reports, relevant laboratory  and other test results, and medications completed Assessed social determinant of health barriers Lab Results  Component Value Date   HGBA1C >15.5 (H) 02/04/2023  -Care guide assisted with making PCP f/u appt during call  Patient Goals/Self-Care Activities: Participate in Transition of Care Program/Attend TOC scheduled calls Take all medications as prescribed Attend all scheduled provider appointments Call provider office for new concerns or questions  check blood sugar at prescribed times: before meals and at bedtime take the blood sugar log to all doctor visits limit fast food meals to no more than 1 per week Obtain BP monitor to check BP in the home  Follow Up Plan:  Telephone follow up appointment with care management team member scheduled for:  next week The patient has been provided with contact information for the care management team and has been advised to call with any health related questions or concerns.          The patient verbalized understanding of instructions, educational materials, and care plan provided today and DECLINED offer to receive copy of patient instructions, educational materials, and care plan.   Telephone follow up appointment with care management team member scheduled WUJ:[id number] week  Please call the care guide team at [phone] if you need to cancel or reschedule your appointment.   Please call the [place] National Suicide Prevention Lifeline: 681-386-6018 or TTY: 843 683 7145 TTY (201) 082-8293) to talk to a trained counselor call 1-800-273-TALK (toll free, 24 hour hotline) if you are experiencing a Mental Health or Behavioral Health Crisis or need someone to talk to.  Antionette Fairy, RN,BSN,CCM Care Management Community Coordinator  Transitions of Care  [facility] - Population Health  Direct Phone: [phone] Toll Free: [phone] Fax: [phone]

## 2023-02-18 ENCOUNTER — Inpatient Hospital Stay: Payer: Federal, State, Local not specified - PPO | Admitting: Family Medicine

## 2023-02-18 ENCOUNTER — Ambulatory Visit: Payer: Self-pay

## 2023-02-18 NOTE — Patient Outreach (Signed)
Care Management  Transitions of Care Program Transitions of Care Post-discharge week 2  02/18/2023 Name: Mark Fry MRN: [id number] DOB: 07/29/1970  Subjective: [name] is a 52 y.o. year old male who is a primary care patient of Jeani Sow, MD. The Care Management team was unable to reach the patient by phone to assess and address transitions of care needs.   Plan: Additional outreach attempts will be made to reach the patient enrolled in the [facility] Program (Post Inpatient/ED Visit).     Antionette Fairy, RN,BSN,CCM RN Care Manager Transitions of Care  [place]/Population Health  Direct Phone: [phone] Toll Free: [phone] Fax: [phone]

## 2023-02-19 ENCOUNTER — Telehealth: Payer: Self-pay

## 2023-02-19 NOTE — Patient Outreach (Signed)
Care Management  Transitions of Care Program Transitions of Care Post-discharge week 2  02/19/2023 Name: Mark Fry MRN: [id number] DOB: 1970-07-17  Subjective: Mark Fry is a 52 y.o. year old male who is a primary care patient of Jeani Sow, MD. The Care Management team was unable to reach the patient by phone to assess and address transitions of care needs.   Plan: Additional outreach attempts will be made to reach the patient enrolled in the [facility] Program (Post Inpatient/ED Visit).     Antionette Fairy, RN,BSN,CCM RN Care Manager Transitions of Care  [place]-[facility]/Population Health  Direct Phone: [phone] Toll Free: [phone] Fax: [phone]

## 2023-02-20 ENCOUNTER — Telehealth: Payer: Self-pay

## 2023-02-20 NOTE — Patient Outreach (Signed)
Care Management  Transitions of Care Program Transitions of Care Post-discharge week 2  02/20/2023 Name: Mark Fry MRN: [id number] DOB: [birth year]  Subjective: Mark Fry is a 52 y.o. year old male who is a primary care patient of Jeani Sow, MD. The Care Management team was unable to reach the patient by phone to assess and address transitions of care needs.   Plan: No further outreach attempts will be made at this time.  We have been unable to reach the patient.   Goals Addressed             This Visit's Progress    COMPLETED: TOC Care Plan       Current Barriers:  Chronic Disease Management support and education needs related to HTN and DMII   RNCM Clinical Goal(s):  Patient will work with the Care Management team over the next 30 days to address Transition of Care Barriers: ongoing education for chronic conditions take all medications exactly as prescribed and will call provider for medication related questions as evidenced by compliance with insulin therapy attend all scheduled medical appointments:   as evidenced by completion of PCP f/u appt demonstrate Improved health management independence as evidenced by monitoring cbgs and BP in the home  through collaboration with RN Care manager, provider, and care team.   Interventions: Evaluation of current treatment plan related to  self management and patient's adherence to plan as established by provider   Diabetes Interventions:  (Status:   case closed-unable to maintain contact with pt ) Short Term Goal Assessed patient's understanding of A1c goal: <7% Provided education to patient about basic DM disease process Reviewed medications with patient and discussed importance of medication adherence Counseled on importance of regular laboratory monitoring as prescribed Discussed plans with patient for ongoing care management follow up and provided patient with direct contact information for care management  team Advised patient, providing education and rationale, to check cbg in the home as ordered(3-4x/day) and record, calling provider for findings outside established parameters Review of patient status, including review of consultants reports, relevant laboratory and other test results, and medications completed Assessed social determinant of health barriers Lab Results  Component Value Date   HGBA1C >15.5 (H) 02/04/2023  -Care guide assisted with making PCP f/u appt during call  Patient Goals/Self-Care Activities: Participate in Transition of Care Program/Attend TOC scheduled calls Take all medications as prescribed Attend all scheduled provider appointments Call provider office for new concerns or questions  check blood sugar at prescribed times: before meals and at bedtime take the blood sugar log to all doctor visits limit fast food meals to no more than 1 per week Obtain BP monitor to check BP in the home  Follow Up Plan:  RN CM will close case at this time.           Antionette Fairy, RN,BSN,CCM RN Care Manager Transitions of Care  [place]-[facility]/Population Health  Direct Phone: [phone] Toll Free: [phone] Fax: [phone]

## 2023-03-17 ENCOUNTER — Encounter: Payer: Self-pay | Admitting: Family Medicine

## 2023-03-17 ENCOUNTER — Ambulatory Visit: Payer: Federal, State, Local not specified - PPO | Admitting: Family Medicine

## 2023-03-17 VITALS — BP 121/75 | HR 123 | Temp 97.9°F | Ht 71.0 in | Wt 137.8 lb

## 2023-03-17 DIAGNOSIS — E1165 Type 2 diabetes mellitus with hyperglycemia: Secondary | ICD-10-CM

## 2023-03-17 DIAGNOSIS — Z794 Long term (current) use of insulin: Secondary | ICD-10-CM

## 2023-03-17 DIAGNOSIS — Z1159 Encounter for screening for other viral diseases: Secondary | ICD-10-CM | POA: Diagnosis not present

## 2023-03-17 DIAGNOSIS — Z139 Encounter for screening, unspecified: Secondary | ICD-10-CM

## 2023-03-17 LAB — CBC WITH DIFFERENTIAL/PLATELET
Basophils Absolute: 0 10*3/uL (ref 0.0–0.1)
Basophils Relative: 0.9 % (ref 0.0–3.0)
Eosinophils Absolute: 0 10*3/uL (ref 0.0–0.7)
Eosinophils Relative: 0.8 % (ref 0.0–5.0)
HCT: 42.4 % (ref 39.0–52.0)
Hemoglobin: 13.8 g/dL (ref 13.0–17.0)
Lymphocytes Relative: 29.4 % (ref 12.0–46.0)
Lymphs Abs: 0.9 10*3/uL (ref 0.7–4.0)
MCHC: 32.5 g/dL (ref 30.0–36.0)
MCV: 84.4 fL (ref 78.0–100.0)
Monocytes Absolute: 0.3 10*3/uL (ref 0.1–1.0)
Monocytes Relative: 9.7 % (ref 3.0–12.0)
Neutro Abs: 1.8 10*3/uL (ref 1.4–7.7)
Neutrophils Relative %: 59.2 % (ref 43.0–77.0)
Platelets: 253 10*3/uL (ref 150.0–400.0)
RBC: 5.03 Mil/uL (ref 4.22–5.81)
RDW: 13.2 % (ref 11.5–15.5)
WBC: 3.1 10*3/uL — ABNORMAL LOW (ref 4.0–10.5)

## 2023-03-17 LAB — COMPREHENSIVE METABOLIC PANEL
ALT: 29 U/L (ref 0–53)
AST: 22 U/L (ref 0–37)
Albumin: 4 g/dL (ref 3.5–5.2)
Alkaline Phosphatase: 74 U/L (ref 39–117)
BUN: 12 mg/dL (ref 6–23)
CO2: 28 meq/L (ref 19–32)
Calcium: 9.4 mg/dL (ref 8.4–10.5)
Chloride: 101 meq/L (ref 96–112)
Creatinine, Ser: 0.83 mg/dL (ref 0.40–1.50)
GFR: 102.23 mL/min (ref >60.00)
Glucose, Bld: 248 mg/dL — ABNORMAL HIGH (ref 70–99)
Potassium: 3.8 meq/L (ref 3.5–5.1)
Sodium: 138 meq/L (ref 135–145)
Total Bilirubin: 0.4 mg/dL (ref 0.2–1.2)
Total Protein: 7.3 g/dL (ref 6.0–8.3)

## 2023-03-17 LAB — GLUCOSE, POCT (MANUAL RESULT ENTRY): POC Glucose: 213 mg/dL — AB (ref 70–99)

## 2023-03-17 MED ORDER — BLOOD GLUCOSE TEST VI STRP: 1.0000 | ORAL_STRIP | Freq: Three times a day (TID) | 3 refills | Status: AC

## 2023-03-17 MED ORDER — LANCET DEVICE MISC: 1.0000 | Freq: Three times a day (TID) | 0 refills | Status: AC

## 2023-03-17 MED ORDER — LANCETS MISC. MISC: 1.0000 | Freq: Three times a day (TID) | 0 refills | Status: DC

## 2023-03-17 MED ORDER — BLOOD GLUCOSE MONITORING SUPPL DEVI: 1.0000 | Freq: Three times a day (TID) | 0 refills | Status: AC

## 2023-03-17 NOTE — Assessment & Plan Note (Signed)
Chronic  uncontrolled w/hyperglycemia.  Pt stopped insulin as thought casing wt loss.  Explained the uncontrolled DM was the cause  educated pt  more compliant since hosp admission  willing to follow w/pharmD and check on resources  doesn't want cgm.  Continue levimir 30units daily and novolog 6units q ac.

## 2023-03-17 NOTE — Progress Notes (Signed)
Subjective:     Patient ID: Mark Fry, male    DOB: 08-May-1971, 52 y.o.   MRN: [id number]  Chief Complaint  Patient presents with   Hospitalization Follow-up    Pt here to f.u on blood sugar levels    Diabetes    HPI ED f/u DM - Presented to the ED on.9/24 with a chief complaint of hyperglycemia and generalized discomfort.  Hospitalized. Dx of Diabetic ketoacidosis without coma associated with type 2 diabetes mellitus (HCC). A1c > 15 in hospital. Reviewed hospital labs. Pt had not followed up here and had stopped his insulin. Today, pt is on Novolog 6 units 3 times a day(ac) and Levemir 30 units daily. States he was not compliant with his medications in the past because he was afraid it was making him lose weight. Sugars running 110s in the morning to 170s in the evening.  Prior to his hospitalization he endorsed frequent urination, dehydration, headaches, and blurry vision. Symptoms have since improved. Sugar at today's visit was 213 not fasting.  Ran out of strips.  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 No pertinent surgical history.   Current Outpatient Medications:    Blood Glucose Monitoring Suppl DEVI, 1 each by Does not apply route in the morning, at noon, and at bedtime. May substitute to any manufacturer covered by patient's insurance., Disp: 1 each, Rfl: 0   Blood Glucose Monitoring Suppl DEVI, 1 each by Does not apply route in the morning, at noon, and at bedtime. May substitute to any manufacturer covered by patient's insurance., Disp: 1 each, Rfl: 0   Glucose Blood (BLOOD GLUCOSE TEST STRIPS) STRP, 1 each by In Vitro route in the morning, at noon, and at bedtime. May substitute to any  manufacturer covered by patient's insurance., Disp: 300 strip, Rfl: 3   insulin aspart (NOVOLOG) 100 UNIT/ML FlexPen, Inject 6 Units into the skin 3 (three) times daily with meals., Disp: 15 mL, Rfl: 11   insulin detemir (LEVEMIR) 100 UNIT/ML FlexPen, Inject 30 Units into the skin daily., Disp: 27 mL, Rfl: 11   Insulin Pen Needle (PEN NEEDLES 3/16") 31G X 5 MM MISC, Take Levemir at bedtime, NovoLog at mealtimes,  as directed, Disp: 100 each, Rfl: 3   Lancet Device MISC, 1 each by Does not apply route in the morning, at noon, and at bedtime. May substitute to any manufacturer covered by patient's insurance., Disp: 1 each, Rfl: 0   Lancets Misc. MISC, 1 each by Does not apply route in the morning, at noon, and at bedtime. May substitute to any manufacturer covered by patient's insurance., Disp: 100 each, Rfl: 0  No Known Allergies ROS neg/noncontributory except as noted HPI/below      Objective:     BP 121/75   Pulse (!) 123   Temp 97.9 F (36.6 C)   Ht 5\' 11"  (1.803 m)   Wt 137 lb 12.8 oz (62.5 kg)   SpO2 100%   BMI 19.22 kg/m  Wt Readings from Last 3 Encounters:  03/17/23 137 lb 12.8 oz (62.5 kg)  02/04/23 126 lb 12.2 oz (57.5 kg)  06/10/22 136 lb 6.4 oz (61.9 kg)   Physical  Exam   Gen: WDWN NAD HEENT: NCAT, conjunctiva not injected, sclera nonicteric.  OP moist NECK:  supple, no thyromegaly, no nodes, no carotid bruits CARDIAC: tachy RRR, S1S2+, no murmur. DP 2+B LUNGS: CTAB. No wheezes ABDOMEN:  BS+, soft, NTND, No HSM, no masses EXT:  no edema MSK: no gross abnormalities.  NEURO: A&O x3.  CN II-XII intact.  PSYCH: normal mood. fair eye contact  Reviewed hosp records    Assessment & Plan:  Uncontrolled type 2 diabetes mellitus with hyperglycemia (HCC) Assessment & Plan: Chronic  uncontrolled w/hyperglycemia.  Pt stopped insulin as thought casing wt loss.  Explained the uncontrolled DM was the cause  educated pt  more compliant since hosp admission  willing to follow  w/pharmD and check on resources  doesn't want cgm.  Continue levimir 30units daily and novolog 6units q ac.    Orders: -     CBC with Differential/Platelet -     Comprehensive metabolic panel -     AMB Referral [facility] Care Management -     POCT glucose (manual entry)  Encounter for long-term (current) use of insulin (HCC)  Encounter for screening involving social determinants of health (SDoH) -     AMB Referral [facility] Care Management  Screening for viral disease -     Hepatitis C antibody  Other orders -     Blood Glucose Monitoring Suppl; 1 each by Does not apply route in the morning, at noon, and at bedtime. May substitute to any manufacturer covered by patient's insurance.  Dispense: 1 each; Refill: 0 -     Blood Glucose Test; 1 each by In Vitro route in the morning, at noon, and at bedtime. May substitute to any manufacturer covered by patient's insurance.  Dispense: 300 strip; Refill: 3 -     Lancet Device; 1 each by Does not apply route in the morning, at noon, and at bedtime. May substitute to any manufacturer covered by patient's insurance.  Dispense: 1 each; Refill: 0 -     Lancets Misc.; 1 each by Does not apply route in the morning, at noon, and at bedtime. May substitute to any manufacturer covered by patient's insurance.  Dispense: 100 each; Refill: 0  3  SDOH-hard to get info from him  some food insecurities.  No housing/utility/xport issues per pt  will do referral.  Hopefully will open up    Return in about 4 weeks (around 04/14/2023) for DM.  Melina Fiddler N Rice,acting as a scribe for Angelena Sole, MD.,have documented all relevant documentation on the behalf of Angelena Sole, MD,as directed by  Angelena Sole, MD while in the presence of Angelena Sole, MD.  Juleen Starr, have reviewed all documentation for this visit. The documentation on 03/17/23 for the exam, diagnosis, procedures, and orders are all accurate and complete.  Angelena Sole, MD

## 2023-03-17 NOTE — Patient Instructions (Signed)

## 2023-03-18 LAB — HEPATITIS C ANTIBODY: Hepatitis C Ab: NONREACTIVE

## 2023-03-18 NOTE — Progress Notes (Signed)
Labs are stable.  Of course, work on sugars.  Wbc remain on lower side-may or may not be anything.  Something to monitor.

## 2023-03-21 ENCOUNTER — Telehealth: Payer: Self-pay

## 2023-03-21 NOTE — Progress Notes (Signed)
   Care Guide Note  03/21/2023 Name: Mark Fry MRN: [id number] DOB: 07-03-1970  Referred by: Jeani Sow, MD Reason for referral : Care Coordination (Outreach to schedule with Pharm d )   Mark Fry is a 52 y.o. year old male who is a primary care patient of Jeani Sow, MD. Mark Fry was referred to the pharmacist for assistance related to HTN and DM.    Successful contact was made with the patient to discuss pharmacy services including being ready for the pharmacist to call at least 5 minutes before the scheduled appointment time, to have medication bottles and any blood sugar or blood pressure readings ready for review. The patient agreed to meet with the pharmacist via with the pharmacist via telephone visit on (date/time).  03/25/2023  Penne Lash, RMA Care Guide [facility]  [place], [place] [address] Direct Dial: [phone] [email or web address]

## 2023-03-25 ENCOUNTER — Encounter: Payer: Self-pay | Admitting: Pharmacist

## 2023-03-25 ENCOUNTER — Other Ambulatory Visit: Payer: Federal, State, Local not specified - PPO | Admitting: Pharmacist

## 2023-03-25 NOTE — Progress Notes (Signed)
03/25/2023 Name: Mark Fry MRN: [id number] DOB: 10/14/1970  Chief Complaint  Patient presents with   Diabetes    Mark Fry is a 52 y.o. year old male who presented for a telephone visit.   They were referred to the pharmacist by their PCP for assistance in managing diabetes.    Subjective:  Care Team: Primary Care Provider: Jeani Sow, MD ; Next Scheduled Visit: 04/2023  Medication Access/Adherence  Current Pharmacy:  [facility] [address] ([address] [place] - [address] [place] ([place]) [place] [address] Phone: [phone] Fax: [phone]   Patient reports affordability concerns with their medications: No  Patient reports access/transportation concerns to their pharmacy: No  Patient reports adherence concerns with their medications:  Yes  - he had stopped insulin prior to ED visit for hyperglycemia   Diabetes: initially diagnoses in 2016  Recent hospitalization for DKA. Patient has run out of insulin. This occurred in past in 2021.   Current medications:  Levemir / insulin detemir 30 units once a day and insulin aspart (Novolog) 6 unit 3 times a day  Medications tried in the past: metformin - stopped at discharge 09/06/2019; Ozempic - prescribed but never filled due to Fry - 2020  Current glucose readings:  per patient this monring blood glucose was 114; last night was in the 200's (patient did not have his blood glucose record available during phone visit)   Using Relion 2  meter; testing 4 to 5 times daily Patient has used Libre Continuous Glucose Monitor in past and has 1 sensor at home however he stopped using because of Fry.  Patient denies hypoglycemic s/sx including no dizziness, shakiness, sweating. Patient reports improved hyperglycemic symptoms - weight loss has stopped, polyuria improved.   Current meal patterns:  Patient reports he is not sure what he should be eating. He has seen nutritionist in past but  last visit was when he was initially diagnosis in 2016. Eats 3 meals and 1 snack per day.  He does not take insulin with him to work - has lunch and a snack at work.  He is worried that someone will take his insulin if he takes it to work.    Current medication access support: reports insulin covered by his [employer] benefits.    Objective:  BP Readings from Last 3 Encounters:  03/17/23 121/75  02/06/23 106/83  06/10/22 98/64     Lab Results  Component Value Date   HGBA1C >15.5 (H) 02/04/2023    Lab Results  Component Value Date   CREATININE 0.83 03/17/2023   BUN 12 03/17/2023   NA 138 03/17/2023   K 3.8 03/17/2023   CL 101 03/17/2023   CO2 28 03/17/2023    No results found for: "CHOL", "HDL", "LDLCALC", "LDLDIRECT", "TRIG", "CHOLHDL"  Medications Reviewed Today     Reviewed by Henrene Pastor, RPH-CPP (Pharmacist) on 03/25/23 at 1512  Med List Status: <None>   Medication Order Taking? Sig Documenting Provider Last Dose Status Informant  Blood Glucose Monitoring Suppl DEVI [id number]  1 each by Does not apply route in the morning, at noon, and at bedtime. May substitute to any manufacturer covered by patient's insurance. Jeani Sow, MD  Active            Med Note Clydie Braun, [facility]   Tue Mar 25, 2023  2:43 PM) Has Relion 2 glucometer  Glucose Blood (BLOOD GLUCOSE TEST STRIPS) STRP [id number] Yes 1 each  by In Vitro route in the morning, at noon, and at bedtime. May substitute to any manufacturer covered by patient's insurance. Jeani Sow, MD Taking Active   insulin aspart (NOVOLOG) 100 UNIT/ML FlexPen [id number] Yes Inject 6 Units into the skin 3 (three) times daily with meals. Marinda Elk, MD Taking Active   insulin detemir (LEVEMIR) 100 UNIT/ML FlexPen [id number] Yes Inject 30 Units into the skin daily. Marinda Elk, MD Taking Active   Insulin Pen Needle (PEN NEEDLES 3/16") 31G X 5 MM MISC [id number] Yes Take Levemir at bedtime, NovoLog  at mealtimes,  as directed Marinda Elk, MD Taking Active   Lancet Device MISC [id number] Yes 1 each by Does not apply route in the morning, at noon, and at bedtime. May substitute to any manufacturer covered by patient's insurance. Jeani Sow, MD Taking Active               Assessment/Plan:   Diabetes:Currently uncontrolled - Reviewed long term cardiovascular and renal outcomes of uncontrolled blood sugar - Reviewed goal A1c, goal fasting, and goal 2 hour post prandial glucose - Reviewed dietary modifications including limiting intake and portion sizes of high CHO foods. Discussed and provided plate method information. Provided phone number for nutritionist with [facility] - Recommend to take Novolog with him to work - important to use prior to each meal to prevent post prandial highs  - I was surprised [facility] still have Levemir in stock since it has been discontinued earlier this year. I did call to see if the still have some in stock but they do not. Patient should have enough Levemir to last until mid December. Then recommend change to [place]. - Provided discout coupon to lower Fry of Novolog to around $35 - can get one for [place] when switch is made.   - Recommend to check glucose at least qam and prior to each meal. Would like to also see some post prandial readings. Discussed benefits of Continuous Glucose Monitor with patient. His insurance will cover [place] 3 sensors with prior authorization but Fry would possibly be $75 per 30 days and $200 per 90 days. Patient declined today but would like to consider after Jan 1st, 2025.    Follow Up Plan: 04/2023 will see Dr Ruthine Dose; 05/2023 follow up with CPP  Henrene Pastor, PharmD Clinical Pharmacist [facility]  [facility] [phone]

## 2023-03-31 ENCOUNTER — Ambulatory Visit: Payer: Self-pay | Admitting: Licensed Clinical Social Worker

## 2023-03-31 NOTE — Patient Instructions (Signed)
Visit Information  Thank you for taking time to visit with me today. Please don't hesitate to contact me if I can be of assistance to you.   Following are the goals we discussed today:   Goals Addressed             This Visit's Progress    Care Coordination Activities       Care Coordination Interventions:  Patient stated that he does not receive food stamps, but does have food, but at times he may run low, he was accepting of receiving a list of food pantries by mail, SW will mail out.   SW will follow up again on 04/14/2023 at 3:00 pm        Our next appointment is by telephone on 04/14/2023 at 3:00 pm  Please call the care guide team at [phone] if you need to cancel or reschedule your appointment.   If you are experiencing a Mental Health or Behavioral Health Crisis or need someone to talk to, please call the Suicide and Crisis Lifeline: 988 go to [facility] Urgent [facility] [address], [place] [phone]) call 911  Patient verbalizes understanding of instructions and care plan provided today and agrees to view in MyChart. Active MyChart status and patient understanding of how to access instructions and care plan via MyChart confirmed with patient.     Jeanie Cooks, PhD [facility], [facility] Social Worker Direct Dial: [phone]  Fax: [phone]

## 2023-04-14 ENCOUNTER — Ambulatory Visit: Payer: Federal, State, Local not specified - PPO | Admitting: Family Medicine

## 2023-04-14 ENCOUNTER — Ambulatory Visit: Payer: Self-pay | Admitting: Licensed Clinical Social Worker

## 2023-04-14 NOTE — Patient Outreach (Signed)
  Care Coordination   04/14/2023 Name: Mark Fry MRN: [id number] DOB: 10-Oct-1970   Care Coordination Outreach Attempts:  An unsuccessful telephone outreach was attempted for a scheduled appointment today.  Follow Up Plan:  Additional outreach attempts will be made to offer the patient care coordination information and services.   Encounter Outcome:  No Answer   Care Coordination Interventions:  No, not indicated    Jeanie Cooks, PhD [facility], [facility] Social Worker Direct Dial: [phone]  Fax: [phone]

## 2023-04-21 ENCOUNTER — Encounter: Payer: Self-pay | Admitting: Family Medicine

## 2023-04-21 ENCOUNTER — Ambulatory Visit: Payer: Federal, State, Local not specified - PPO | Admitting: Family Medicine

## 2023-04-21 VITALS — BP 110/80 | HR 123 | Temp 98.1°F | Resp 18 | Ht 71.0 in | Wt 133.4 lb

## 2023-04-21 DIAGNOSIS — R000 Tachycardia, unspecified: Secondary | ICD-10-CM | POA: Diagnosis not present

## 2023-04-21 DIAGNOSIS — Z794 Long term (current) use of insulin: Secondary | ICD-10-CM | POA: Diagnosis not present

## 2023-04-21 DIAGNOSIS — E1165 Type 2 diabetes mellitus with hyperglycemia: Secondary | ICD-10-CM | POA: Diagnosis not present

## 2023-04-21 MED ORDER — CARVEDILOL 3.125 MG PO TABS
3.1250 mg | ORAL_TABLET | Freq: Two times a day (BID) | ORAL | 1 refills | Status: AC
Start: 2023-04-21 — End: ?

## 2023-04-21 NOTE — Patient Instructions (Addendum)
It was very nice to see you today!  Happy Holidays  Benefiber  daily.Marland Kitchen  drink more water.   PLEASE NOTE:  If you had any lab tests please let us know if you have not heard back within a few days. You may see your results on MyChart before we have a chance to review them but we will give you a call once they are reviewed by [place]. If we ordered any referrals today, please let us know if you have not heard from their office within the next week.   Please try these tips to maintain a healthy lifestyle:  Eat most of your calories during the day when you are active. Eliminate processed foods including packaged sweets (pies, cakes, cookies), reduce intake of potatoes, white bread, white pasta, and white rice. Look for whole grain options, oat flour or almond flour.  Each meal should contain half fruits/vegetables, one quarter protein, and one quarter carbs (no bigger than a computer mouse).  Cut down on sweet beverages. This includes juice, soda, and sweet tea. Also watch fruit intake, though this is a healthier sweet option, it still contains natural sugar! Limit to 3 servings daily.  Drink at least 1 glass of water with each meal and aim for at least 8 glasses per day  Exercise at least 150 minutes every week.

## 2023-04-21 NOTE — Progress Notes (Signed)
Subjective:     Patient ID: Mark Fry, male    DOB: [birth year], 52 y.o.   MRN: [id number]  Chief Complaint  Patient presents with   Medical Management of Chronic Issues    4 week follow-up on dm Not fasting     HPI DM - was off meds and admitted to hosp.  Now, pt is on Novolog 6 units 3 times a day(ac) and Levemir 30 units daily. States he was not compliant with his medications in the past because he was afraid it was making him lose weight. Sugars running 110s in the morning to 170s in the evening.   Since hosp, has been compliant.  Working w/Pharm D as well on meds.  Still has few more pens of Levimir.  Checking sugars 3-4x/day.  Not taking at lunch.  Running 100-200.  Lowest 78, highest 300.   In am eats breakfast at 5am and taking the 6 units and then lunch at 12.   Has a snack at 9 .  No insulin all day till dinner. Does not have a bp cuff.  Not drinking a lot of water.  Getting constipated-took exlax.unaware of tachycardia-no symptoms.  Denies sudafed, drug use.   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 No pertinent surgical history.   Current Outpatient Medications:    Blood Glucose Monitoring Suppl DEVI, 1 each by Does not apply route in the morning, at noon, and at bedtime. May substitute to any manufacturer covered by patient's insurance., Disp: 1 each, Rfl: 0   carvedilol (COREG) 3.125 MG tablet, Take 1 tablet (3.125 mg total) by mouth 2 (two) times daily with a meal., Disp: 60 tablet, Rfl: 1   Glucose Blood (BLOOD GLUCOSE TEST STRIPS) STRP, 1 each by In Vitro route in the morning, at noon, and at bedtime. May substitute to any manufacturer covered by patient's insurance., Disp: 300  strip, Rfl: 3   insulin aspart (NOVOLOG) 100 UNIT/ML FlexPen, Inject 6 Units into the skin 3 (three) times daily with meals., Disp: 15 mL, Rfl: 11   insulin detemir (LEVEMIR) 100 UNIT/ML FlexPen, Inject 30 Units into the skin daily., Disp: 27 mL, Rfl: 11   Insulin Pen Needle (PEN NEEDLES 3/16") 31G X 5 MM MISC, Take Levemir at bedtime, NovoLog at mealtimes,  as directed, Disp: 100 each, Rfl: 3  No Known Allergies ROS neg/noncontributory except as noted HPI/below No HA, CP/SOB.     Objective:     BP 110/80 (BP Location: Left Arm, Patient Position: Sitting, Cuff Size: Normal)   Pulse (!) 123   Temp 98.1 F (36.7 C) (Temporal)   Resp 18   Ht 5\' 11"  (1.803 m)   Wt 133 lb 6 oz (60.5 kg)   SpO2 99%   BMI 18.60 kg/m  Wt Readings from Last 3 Encounters:  04/21/23 133 lb 6 oz (60.5 kg)  03/17/23 137 lb 12.8 oz (62.5 kg)  02/04/23 126 lb 12.2 oz (57.5 kg)   Physical Exam   Gen: WDWN NAD HEENT: NCAT, conjunctiva not injected, sclera nonicteric.  OP moist NECK:  supple, no thyromegaly, no nodes, no carotid bruits CARDIAC: tachy RRR, S1S2+, no murmur. DP 2+B LUNGS:  CTAB. No wheezes ABDOMEN:  BS+, soft, NTND, No HSM, no masses EXT:  no edema MSK: no gross abnormalities.  NEURO: A&O x3.  CN II-XII intact.  PSYCH: normal mood. fair eye contact  Reviewed hosp records-long h/o tachycardia    Assessment & Plan:  Uncontrolled type 2 diabetes mellitus with hyperglycemia (HCC) Assessment & Plan: Chronic  uncontrolled w/hyperglycemia. Not taking mealtime insulin except at supper.  Educated pt more on DM and need to take mealtime insulin.  He has some hang ups about lunch so really needs to take at least at breakfast and supper.  Working w/Pharm D as well.  Continue levimir 30units daily and novolog 6units q ac.     Tachycardia Assessment & Plan: Chronic in review of ER notes.  Has not had regular f/u.  Declines Card for now.  Will do coreg 3.125mg   bid-limited by bp's.  Monitor    Orders: -     Carvedilol; Take 1 tablet (3.125 mg total) by mouth 2 (two) times daily with a meal.  Dispense: 60 tablet; Refill: 1  Encounter for long-term (current) use of insulin (HCC)  Pt will need to be on statin, but reluctant to take meds and  needs time to grasp all this.  He states he has plenty of insulin for now and will let us know when running low and will change levimir to tresiba 30mg    Return in about 4 weeks (around 05/19/2023) for DM, tachycardia.  Angelena Sole, MD

## 2023-04-21 NOTE — Assessment & Plan Note (Signed)
Chronic  uncontrolled w/hyperglycemia. Not taking mealtime insulin except at supper.  Educated pt more on DM and need to take mealtime insulin.  He has some hang ups about lunch so really needs to take at least at breakfast and supper.  Working w/Pharm D as well.  Continue levimir 30units daily and novolog 6units q ac.

## 2023-04-21 NOTE — Assessment & Plan Note (Signed)
Chronic in review of ER notes.  Has not had regular f/u.  Declines Card for now.  Will do coreg 3.125mg   bid-limited by bp's.  Monitor

## 2023-04-29 ENCOUNTER — Ambulatory Visit: Payer: Self-pay | Admitting: Licensed Clinical Social Worker

## 2023-04-29 NOTE — Patient Outreach (Signed)
  Care Coordination   04/29/2023 Name: Mark Fry MRN: [id number] DOB: April 23, 1971   Care Coordination Outreach Attempts:  An unsuccessful outreach was attempted for an appointment today.  Follow Up Plan:  Additional outreach attempts will be made to offer the patient complex care management information and services.   Encounter Outcome:  No Answer   Care Coordination Interventions:  No, not indicated    Jeanie Cooks, PhD [facility], [facility] Social Worker Direct Dial: [phone]  Fax: [phone]

## 2023-05-19 ENCOUNTER — Ambulatory Visit: Payer: Federal, State, Local not specified - PPO | Attending: Family Medicine

## 2023-05-19 ENCOUNTER — Ambulatory Visit: Payer: Federal, State, Local not specified - PPO | Admitting: Family Medicine

## 2023-05-19 ENCOUNTER — Encounter: Payer: Self-pay | Admitting: Family Medicine

## 2023-05-19 VITALS — BP 112/54 | HR 118 | Temp 97.5°F | Resp 18 | Ht 71.0 in | Wt 130.4 lb

## 2023-05-19 DIAGNOSIS — R000 Tachycardia, unspecified: Secondary | ICD-10-CM

## 2023-05-19 DIAGNOSIS — E1165 Type 2 diabetes mellitus with hyperglycemia: Secondary | ICD-10-CM | POA: Diagnosis not present

## 2023-05-19 LAB — LIPID PANEL
Cholesterol: 185 mg/dL (ref 0–200)
HDL: 50.8 mg/dL (ref >39.00)
LDL Cholesterol: 120 mg/dL — ABNORMAL HIGH (ref 0–99)
NonHDL: 134.07
Total CHOL/HDL Ratio: 4
Triglycerides: 69 mg/dL (ref 0.0–149.0)
VLDL: 13.8 mg/dL (ref 0.0–40.0)

## 2023-05-19 LAB — CBC WITH DIFFERENTIAL/PLATELET
Basophils Absolute: 0 10*3/uL (ref 0.0–0.1)
Basophils Relative: 0.6 % (ref 0.0–3.0)
Eosinophils Absolute: 0 10*3/uL (ref 0.0–0.7)
Eosinophils Relative: 0.5 % (ref 0.0–5.0)
HCT: 39.8 % (ref 39.0–52.0)
Hemoglobin: 13.6 g/dL (ref 13.0–17.0)
Lymphocytes Relative: 40.3 % (ref 12.0–46.0)
Lymphs Abs: 1.1 10*3/uL (ref 0.7–4.0)
MCHC: 34.2 g/dL (ref 30.0–36.0)
MCV: 80.9 fL (ref 78.0–100.0)
Monocytes Absolute: 0.2 10*3/uL (ref 0.1–1.0)
Monocytes Relative: 7.5 % (ref 3.0–12.0)
Neutro Abs: 1.4 10*3/uL (ref 1.4–7.7)
Neutrophils Relative %: 51.1 % (ref 43.0–77.0)
Platelets: 236 10*3/uL (ref 150.0–400.0)
RBC: 4.92 Mil/uL (ref 4.22–5.81)
RDW: 12.9 % (ref 11.5–15.5)
WBC: 2.8 10*3/uL — ABNORMAL LOW (ref 4.0–10.5)

## 2023-05-19 LAB — HEMOGLOBIN A1C: Hgb A1c MFr Bld: 12.8 % — ABNORMAL HIGH (ref 4.6–6.5)

## 2023-05-19 LAB — COMPREHENSIVE METABOLIC PANEL
ALT: 21 U/L (ref 0–53)
AST: 15 U/L (ref 0–37)
Albumin: 4 g/dL (ref 3.5–5.2)
Alkaline Phosphatase: 55 U/L (ref 39–117)
BUN: 10 mg/dL (ref 6–23)
CO2: 28 meq/L (ref 19–32)
Calcium: 9.2 mg/dL (ref 8.4–10.5)
Chloride: 105 meq/L (ref 96–112)
Creatinine, Ser: 0.77 mg/dL (ref 0.40–1.50)
GFR: 104.45 mL/min (ref >60.00)
Glucose, Bld: 151 mg/dL — ABNORMAL HIGH (ref 70–99)
Potassium: 3.3 meq/L — ABNORMAL LOW (ref 3.5–5.1)
Sodium: 140 meq/L (ref 135–145)
Total Bilirubin: 0.5 mg/dL (ref 0.2–1.2)
Total Protein: 6.9 g/dL (ref 6.0–8.3)

## 2023-05-19 LAB — MICROALBUMIN / CREATININE URINE RATIO
Creatinine,U: 253.8 mg/dL
Microalb Creat Ratio: 4.9 mg/g (ref 0.0–30.0)
Microalb, Ur: 12.5 mg/dL — ABNORMAL HIGH (ref 0.0–1.9)

## 2023-05-19 LAB — TSH: TSH: 4.41 u[IU]/mL (ref 0.35–5.50)

## 2023-05-19 NOTE — Progress Notes (Signed)
 Subjective:     Patient ID: Mark Fry, male    DOB: 1970/09/09, 53 y.o.   MRN: [id number]  Chief Complaint  Patient presents with   Medical Management of Chronic Issues    4 week follow-up on dm, tachycardia Fasting Need FMLA paperwork filled out    HPI DM - was off meds and admitted to hosp in Sept.  Now, pt is on Novolog  6 units 2 times a day(ac) and Levemir  30 units daily. States he was not compliant with his medications in the past because he was afraid it was making him lose weight. Sugars running 100s in the morning to 200s in the evening.   Since hosp, has been compliant.  Working w/Pharm D as well on meds.  Still has few more pens of Levimir.  Checking sugars 3-4x/day.  Lowest 100, highest 300.   At times, not feel well so misses work. Can be dizzy at times or nauseous.  Can be dizzy getting up in am       Does not have a bp cuff.  Not drinking a lot of water .   tachycardia-no symptoms.  Denies sudafed, drug use.   Doesn't think anxious but does worry.  No cp.  Was taking coreg  bid and could feel funny in chest.  So taking one/day.   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 Sister helps as well   Health Maintenance Due  Topic Date Due   OPHTHALMOLOGY EXAM  Never done   FOOT EXAM  03/29/2020    Past Medical History:  Diagnosis Date   Diabetes mellitus without complication (HCC)    Hyperlipidemia 02/04/2023   Nephrolithiasis     History reviewed. No pertinent surgical history.   Current Outpatient Medications:    Blood Glucose Monitoring Suppl DEVI, 1 each by Does not apply route in the morning, at noon, and at bedtime. May substitute to any manufacturer covered by patient's insurance., Disp: 1 each, Rfl: 0   carvedilol  (COREG ) 3.125 MG tablet, Take 1 tablet (3.125 mg total) by mouth 2 (two) times daily with a meal., Disp: 60 tablet, Rfl: 1   Glucose Blood (BLOOD GLUCOSE TEST STRIPS) STRP, 1 each by In Vitro route in the  morning, at noon, and at bedtime. May substitute to any manufacturer covered by patient's insurance., Disp: 300 strip, Rfl: 3   insulin  aspart (NOVOLOG ) 100 UNIT/ML FlexPen, Inject 6 Units into the skin 3 (three) times daily with meals., Disp: 15 mL, Rfl: 11   insulin  detemir (LEVEMIR ) 100 UNIT/ML FlexPen, Inject 30 Units into the skin daily., Disp: 27 mL, Rfl: 11   Insulin  Pen Needle (PEN NEEDLES 3/16) 31G X 5 MM MISC, Take Levemir  at bedtime, NovoLog  at mealtimes,  as directed, Disp: 100 each, Rfl: 3  No Known Allergies ROS neg/noncontributory except as noted HPI/below      Objective:     BP (!) 112/54   Pulse (!) 118   Temp (!) 97.5 F (36.4 C) (Temporal)   Resp 18   Ht 5' 11 (1.803 m)   Wt 130 lb 6 oz (59.1 kg)   SpO2 99%   BMI 18.18 kg/m  Wt Readings from Last 3 Encounters:  05/19/23 130 lb 6 oz (59.1 kg)  04/21/23 133 lb 6 oz (60.5 kg)  03/17/23 137 lb 12.8 oz (62.5 kg)   Physical Exam   Gen: WDWN NAD HEENT: NCAT, conjunctiva not injected, sclera nonicteric.  OP moist NECK:  supple, no thyromegaly, no nodes, no carotid bruits CARDIAC: tachy RRR, S1S2+, no murmur. DP 2+B LUNGS: CTAB. No wheezes ABDOMEN:  BS+, soft, NTND, No HSM, no masses EXT:  no edema MSK: no gross abnormalities.  NEURO: A&O x3.  CN II-XII intact.  PSYCH: normal mood. fair eye contact     Assessment & Plan:  Uncontrolled type 2 diabetes mellitus with hyperglycemia (HCC) Assessment & Plan: Chronic  uncontrolled w/hyperglycemia. Doing better taking mealtime insulin  bid.  Educated pt more on DM and need to take mealtime insulin ..  Working w/Pharm D as well.  Continue levimir 30units daily and novolog  6units q ac.  He has about 2 wks of Levimir left and will let us  know when needs refill as will have to change to tresiba  30mg  daily.      Orders: -     Comprehensive metabolic panel -     Hemoglobin A1c -     Lipid panel -     Microalbumin / creatinine urine ratio  Tachycardia Assessment &  Plan: Chronic in review of ER notes..  Declines Card for now. Taking coreg  3.125mg  once/day as SE to bid.  Not sure if anxiety, dehydration, nutritional, thyroid , other.  Will check labs.  Will orden event monitor.   Orders: -     Comprehensive metabolic panel -     CBC with Differential/Platelet -     TSH -     LONG TERM MONITOR (3-14 DAYS); Future  Pt will need to be on statin, but reluctant to take meds and  needs time to grasp all this.  He states he has plenty of insulin  for now and will let us  know when running low and will change levimir to tresiba  30mg   FMLA-needs filled out.  Advised, mom's doctor needs to do one for her conditions.  He misses 0-2 days/wk occ for dizziness and generally not feeling well.    Return in about 4 weeks (around 06/16/2023) for DM.  Jenkins CHRISTELLA Carrel, MD

## 2023-05-19 NOTE — Assessment & Plan Note (Signed)
 Chronic in review of ER notes..  Declines Card for now. Taking coreg 3.125mg  once/day as SE to bid.  Not sure if anxiety, dehydration, nutritional, thyroid, other.  Will check labs.  Will orden event monitor.

## 2023-05-19 NOTE — Assessment & Plan Note (Signed)
 Chronic  uncontrolled w/hyperglycemia. Doing better taking mealtime insulin  bid.  Educated pt more on DM and need to take mealtime insulin ..  Working w/Pharm D as well.  Continue levimir 30units daily and novolog  6units q ac.  He has about 2 wks of Levimir left and will let us  know when needs refill as will have to change to tresiba  30mg  daily.

## 2023-05-19 NOTE — Patient Instructions (Signed)

## 2023-05-19 NOTE — Progress Notes (Signed)
 Thyroid is fine.  Sugars have improved but of course still need work  Potassium is a little low-send in daily #90 Should be on cholesterol meds.  Is he willing?  If so, crestor 10mg  daily

## 2023-05-20 ENCOUNTER — Other Ambulatory Visit: Payer: Self-pay | Admitting: *Deleted

## 2023-05-20 ENCOUNTER — Ambulatory Visit: Payer: Self-pay | Admitting: Licensed Clinical Social Worker

## 2023-05-20 MED ORDER — POTASSIUM CHLORIDE ER 10 MEQ PO TBCR: 10.0000 meq | EXTENDED_RELEASE_TABLET | Freq: Every day | ORAL | 0 refills | Status: AC

## 2023-05-20 MED ORDER — ROSUVASTATIN CALCIUM 10 MG PO TABS: 10.0000 mg | ORAL_TABLET | Freq: Every day | ORAL | 3 refills | Status: AC

## 2023-05-20 NOTE — Patient Outreach (Signed)
  Care Coordination   05/20/2023 Name: Mark Fry MRN: [id number] DOB: [birth year]   Care Coordination Outreach Attempts:  An unsuccessful outreach was attempted for an appointment today.  Follow Up Plan:  No further outreach attempts will be made at this time. We have been unable to contact the patient to offer or enroll patient in complex care management services.  Encounter Outcome:  No Answer   Care Coordination Interventions:  No, not indicated    Tobias CHARM Maranda HEDWIG, PhD [facility], [facility] Social Worker Direct Dial: [phone]  Fax: [phone]

## 2023-05-20 NOTE — Progress Notes (Unsigned)
 EP to read.

## 2023-05-21 ENCOUNTER — Telehealth: Payer: Self-pay | Admitting: *Deleted

## 2023-05-21 NOTE — Telephone Encounter (Signed)
 Left message for patient informing him that forms have been completed and ready for pick-up. Forms placed up front.

## 2023-05-27 ENCOUNTER — Telehealth: Payer: Self-pay

## 2023-05-27 ENCOUNTER — Other Ambulatory Visit: Payer: Self-pay | Admitting: Pharmacist

## 2023-05-27 NOTE — Progress Notes (Signed)
 Complex Care Management Care Guide Note  05/27/2023 Name: Mark Fry MRN: [id number] DOB: 11-24-1970  Mark Fry is a 53 y.o. year old male who is a primary care patient of Wendolyn Jenkins Jansky, MD and is actively engaged with the care management team. I reached out to Mark Fry by phone today to assist with re-scheduling  with the Pharmacist.  Follow up plan: Unsuccessful telephone outreach attempt made. A HIPAA compliant phone message was left for the patient providing contact information and requesting a return call.  Jeoffrey Buffalo , RMA     [facility]  [facility], [facility] Guide  Direct Dial: [phone]  Website: [email or web address]

## 2023-06-16 ENCOUNTER — Ambulatory Visit: Payer: Federal, State, Local not specified - PPO | Admitting: Family Medicine

## 2023-06-16 ENCOUNTER — Other Ambulatory Visit: Payer: Self-pay | Admitting: Family Medicine

## 2023-06-16 MED ORDER — INSULIN DETEMIR 100 UNIT/ML FLEXPEN: 30.0000 [IU] | PEN_INJECTOR | Freq: Every day | SUBCUTANEOUS | 11 refills | Status: DC

## 2023-06-16 NOTE — Telephone Encounter (Signed)
Copied from CRM [id number]. Topic: Clinical - Medication Refill >> Jun 16, 2023  8:02 AM Efraim Kaufmann C wrote: Most Recent Primary Care Visit:  Provider: Ruthine Dose, ANN MARIE  Department: [facility]  Visit Type: OFFICE VISIT  Date: 05/19/2023  Medication: insulin detemir (LEVEMIR) 100 UNIT/ML FlexPen  Has the patient contacted their pharmacy? No (Agent: If no, request that the patient contact the pharmacy for the refill. If patient does not wish to contact the pharmacy document the reason why and proceed with request.) (Agent: If yes, when and what did the pharmacy advise?)  Is this the correct pharmacy for this prescription? Yes If no, delete pharmacy and type the correct one.  This is the patient's preferred pharmacy:  [facility] [address] ([address]), [place] - [address] [place] ([place]) [place] [address] Phone: [phone] Fax: [phone]   Has the prescription been filled recently? No  Is the patient out of the medication? Yes  Has the patient been seen for an appointment in the last year OR does the patient have an upcoming appointment? Yes  Can we respond through MyChart? Yes  Agent: Please be advised that Rx refills may take up to 3 business days. We ask that you follow-up with your pharmacy.

## 2023-06-23 NOTE — Progress Notes (Signed)
 Complex Care Management Care Guide Note  06/23/2023 Name: Mark Fry MRN: [id number] DOB: 1971/05/02  Clearence Cheek Hufstetler is a 53 y.o. year old male who is a primary care patient of Jeani Sow, MD and is actively engaged with the care management team. I reached out to Jena Gauss by phone today to assist with re-scheduling  with the Pharmacist.  Follow up plan: Unsuccessful telephone outreach attempt made. A HIPAA compliant phone message was left for the patient providing contact information and requesting a return call.  Penne Lash , RMA     [facility]  [facility], [facility] Guide  Direct Dial: [phone]  Website: [email or web address]

## 2023-07-07 ENCOUNTER — Ambulatory Visit: Payer: Federal, State, Local not specified - PPO | Admitting: Family Medicine

## 2023-07-09 ENCOUNTER — Telehealth: Payer: Self-pay | Admitting: Family Medicine

## 2023-07-09 NOTE — Telephone Encounter (Signed)
 Copied from CRM [id number]. Topic: Clinical - Prescription Issue >> Jul 09, 2023 12:25 PM Denese Killings wrote: Reason for CRM: Patient stated that pharmacy advised that they no longer carry insulin detemir (LEVEMIR) 100 UNIT/ML FlexPen and he will need to be prescribed something else.

## 2023-07-10 ENCOUNTER — Encounter (HOSPITAL_COMMUNITY): Payer: Self-pay

## 2023-07-10 ENCOUNTER — Other Ambulatory Visit: Payer: Self-pay | Admitting: Family Medicine

## 2023-07-10 ENCOUNTER — Emergency Department (HOSPITAL_COMMUNITY)
Admission: EM | Admit: 2023-07-10 | Discharge: 2023-07-10 | Disposition: A | Discharge status: Home / Self Care | Payer: Federal, State, Local not specified - PPO | Attending: Emergency Medicine | Admitting: Emergency Medicine

## 2023-07-10 ENCOUNTER — Other Ambulatory Visit: Payer: Self-pay

## 2023-07-10 DIAGNOSIS — E1065 Type 1 diabetes mellitus with hyperglycemia: Secondary | ICD-10-CM | POA: Insufficient documentation

## 2023-07-10 DIAGNOSIS — R000 Tachycardia, unspecified: Secondary | ICD-10-CM | POA: Insufficient documentation

## 2023-07-10 DIAGNOSIS — Z794 Long term (current) use of insulin: Secondary | ICD-10-CM | POA: Insufficient documentation

## 2023-07-10 LAB — BASIC METABOLIC PANEL
Anion gap: 11 (ref 5–15)
BUN: 16 mg/dL (ref 6–20)
CO2: 23 mmol/L (ref 22–32)
Calcium: 8.8 mg/dL — ABNORMAL LOW (ref 8.9–10.3)
Chloride: 97 mmol/L — ABNORMAL LOW (ref 98–111)
Creatinine, Ser: 0.81 mg/dL (ref 0.61–1.24)
GFR, Estimated: >60 mL/min (ref >60)
Glucose, Bld: 589 mg/dL (ref 70–99)
Potassium: 3.9 mmol/L (ref 3.5–5.1)
Sodium: 131 mmol/L — ABNORMAL LOW (ref 135–145)

## 2023-07-10 LAB — CBG MONITORING, ED
Glucose-Capillary: 535 mg/dL (ref 70–99)
Glucose-Capillary: 458 mg/dL — ABNORMAL HIGH (ref 70–99)
Glucose-Capillary: 474 mg/dL — ABNORMAL HIGH (ref 70–99)
Glucose-Capillary: 400 mg/dL — ABNORMAL HIGH (ref 70–99)
Glucose-Capillary: 286 mg/dL — ABNORMAL HIGH (ref 70–99)
Glucose-Capillary: 233 mg/dL — ABNORMAL HIGH (ref 70–99)

## 2023-07-10 LAB — URINALYSIS, ROUTINE W REFLEX MICROSCOPIC
Bacteria, UA: NONE SEEN
Bilirubin Urine: NEGATIVE
Glucose, UA: >=500 mg/dL — AB
Hgb urine dipstick: NEGATIVE
Ketones, ur: 20 mg/dL — AB
Leukocytes,Ua: NEGATIVE
Nitrite: NEGATIVE
Protein, ur: NEGATIVE mg/dL
Specific Gravity, Urine: 1.037 — ABNORMAL HIGH (ref 1.005–1.030)
pH: 5 (ref 5.0–8.0)

## 2023-07-10 LAB — I-STAT CHEM 8, ED
BUN: 14 mg/dL (ref 6–20)
Calcium, Ion: 1.14 mmol/L — ABNORMAL LOW (ref 1.15–1.40)
Chloride: 97 mmol/L — ABNORMAL LOW (ref 98–111)
Creatinine, Ser: 0.9 mg/dL (ref 0.61–1.24)
Glucose, Bld: 584 mg/dL (ref 70–99)
HCT: 38 % — ABNORMAL LOW (ref 39.0–52.0)
Hemoglobin: 12.9 g/dL — ABNORMAL LOW (ref 13.0–17.0)
Potassium: 4 mmol/L (ref 3.5–5.1)
Sodium: 134 mmol/L — ABNORMAL LOW (ref 135–145)
TCO2: 23 mmol/L (ref 22–32)

## 2023-07-10 LAB — BLOOD GAS, VENOUS
Acid-Base Excess: 2.5 mmol/L — ABNORMAL HIGH (ref 0.0–2.0)
Bicarbonate: 27.9 mmol/L (ref 20.0–28.0)
O2 Saturation: 66.5 %
Patient temperature: 37
pCO2, Ven: 45 mm[Hg] (ref 44–60)
pH, Ven: 7.4 (ref 7.25–7.43)
pO2, Ven: 37 mm[Hg] (ref 32–45)

## 2023-07-10 LAB — CBC
HCT: 39.4 % (ref 39.0–52.0)
Hemoglobin: 12.9 g/dL — ABNORMAL LOW (ref 13.0–17.0)
MCH: 26.8 pg (ref 26.0–34.0)
MCHC: 32.7 g/dL (ref 30.0–36.0)
MCV: 81.9 fL (ref 80.0–100.0)
Platelets: 224 K/uL (ref 150–400)
RBC: 4.81 MIL/uL (ref 4.22–5.81)
RDW: 12.9 % (ref 11.5–15.5)
WBC: 3.4 K/uL — ABNORMAL LOW (ref 4.0–10.5)
nRBC: 0 % (ref 0.0–0.2)

## 2023-07-10 LAB — BETA-HYDROXYBUTYRIC ACID: Beta-Hydroxybutyric Acid: 0.71 mmol/L — ABNORMAL HIGH (ref 0.05–0.27)

## 2023-07-10 MED ORDER — INSULIN GLARGINE 100 UNIT/ML ~~LOC~~ SOLN
30.0000 [IU] | Freq: Once | SUBCUTANEOUS | Status: AC
  Administered 2023-07-10: 30 [IU] via SUBCUTANEOUS
  Filled 2023-07-10: qty 0.3

## 2023-07-10 MED ORDER — LACTATED RINGERS IV SOLN: INTRAVENOUS | Status: DC

## 2023-07-10 MED ORDER — DEXTROSE IN LACTATED RINGERS 5 % IV SOLN: INTRAVENOUS | Status: DC

## 2023-07-10 MED ORDER — INSULIN DEGLUDEC 100 UNIT/ML ~~LOC~~ SOPN: 30.0000 [IU] | PEN_INJECTOR | Freq: Every day | SUBCUTANEOUS | 1 refills | Status: AC

## 2023-07-10 MED ORDER — LACTATED RINGERS IV BOLUS
1500.0000 mL | Freq: Once | INTRAVENOUS | Status: AC
  Administered 2023-07-10: 1500 mL via INTRAVENOUS

## 2023-07-10 MED ORDER — INSULIN DETEMIR 100 UNIT/ML FLEXPEN: 30.0000 [IU] | Freq: Once | SUBCUTANEOUS | Status: DC

## 2023-07-10 MED ORDER — INSULIN REGULAR(HUMAN) IN NACL 100-0.9 UT/100ML-% IV SOLN
INTRAVENOUS | Status: DC
  Administered 2023-07-10: 9 [IU]/h via INTRAVENOUS
  Filled 2023-07-10: qty 100

## 2023-07-10 MED ORDER — DEXTROSE 50 % IV SOLN: 0.0000 mL | INTRAVENOUS | Status: DC | PRN

## 2023-07-10 NOTE — Discharge Instructions (Addendum)
 Start taking the [place] medication to replace your Levemir.  Continue your short acting insulin in addition to the long-acting Tresiba.  Follow-up with your primary care doctor to be rechecked.

## 2023-07-10 NOTE — ED Provider Triage Note (Signed)
 Emergency Medicine Provider Triage Evaluation Note  Fahd Galea , a 53 y.o. male  was evaluated in triage.  Pt complains of Hyperglycemia out of his usual DM meds.  Review of Systems  Positive: Polyuria, polydipsia, dry mouth,  Negative: Fever, chills, N/V/D, abd pain, SHOB, CP  Physical Exam  BP 106/77   Pulse (!) 130   Temp 98.3 F (36.8 C) (Oral)   Resp 16   Ht 5\' 11"  (1.803 m)   Wt 61.2 kg   SpO2 96%   BMI 18.83 kg/m  Gen:   Awake, no distress   Resp:  Normal effort  MSK:   Moves extremities without difficulty  Other:    Medical Decision Making  Medically screening exam initiated at 12:53 PM.  Appropriate orders placed.  Rhyse Izrael Peak was informed that the remainder of the evaluation will be completed by another provider, this initial triage assessment does not replace that evaluation, and the importance of remaining in the ED until their evaluation is complete.  Labs ordered   Dolphus Jenny, PA-C 07/10/23 1255

## 2023-07-10 NOTE — ED Provider Notes (Signed)
 [place] EMERGENCY DEPARTMENT AT [facility] Provider Note   CSN: [id number] Arrival date & time: 07/10/23  1224     History  Chief Complaint  Patient presents with   Hyperglycemia    Mark Fry is a 53 y.o. male.   Hyperglycemia    Patient has a history of diabetes hyperlipidemia kidney stones.  Patient states he has been his Levemir for the last couple of weeks.  He was supposed to get the prescription from his pharmacy.  Initially they told him it would be a couple of days.  They then called back and told him that his doctor needed to switch his medication to an alternative.  Patient states he contacted his doctor but was told it may take a day or 2 for the prescription to go through to the pharmacy.  Patient was getting ready for work today he started to feel lightheaded.  He noticed that his blood sugars been running very high.  He came to the ED for evaluation.  Patient denies any vomiting or diarrhea.  No fevers or chills  Home Medications Prior to Admission medications   Medication Sig Start Date End Date Taking? Authorizing Provider  insulin degludec (TRESIBA) 100 UNIT/ML FlexTouch Pen Inject 30 Units into the skin daily. 07/10/23  Yes Linwood Dibbles, MD  Blood Glucose Monitoring Suppl DEVI 1 each by Does not apply route in the morning, at noon, and at bedtime. May substitute to any manufacturer covered by patient's insurance. 03/17/23   Jeani Sow, MD  carvedilol (COREG) 3.125 MG tablet Take 1 tablet (3.125 mg total) by mouth 2 (two) times daily with a meal. 04/21/23   Jeani Sow, MD  Glucose Blood (BLOOD GLUCOSE TEST STRIPS) STRP 1 each by In Vitro route in the morning, at noon, and at bedtime. May substitute to any manufacturer covered by patient's insurance. 03/17/23   Jeani Sow, MD  insulin aspart (NOVOLOG) 100 UNIT/ML FlexPen Inject 6 Units into the skin 3 (three) times daily with meals. 02/06/23   Marinda Elk, MD  Insulin Pen Needle  (PEN NEEDLES 3/16") 31G X 5 MM MISC Take Levemir at bedtime, NovoLog at mealtimes,  as directed 02/06/23   Marinda Elk, MD  potassium chloride (KLOR-CON) 10 MEQ tablet Take 1 tablet (10 mEq total) by mouth daily. 05/20/23   Jeani Sow, MD  rosuvastatin (CRESTOR) 10 MG tablet Take 1 tablet (10 mg total) by mouth daily. 05/20/23   Jeani Sow, MD      Allergies    Patient has no known allergies.    Review of Systems   Review of Systems  Physical Exam Updated Vital Signs BP (!) 125/90   Pulse (!) 114   Temp 98.6 F (37 C) (Oral)   Resp 16   Ht 1.803 m (5\' 11" )   Wt 61.2 kg   SpO2 100%   BMI 18.83 kg/m  Physical Exam Vitals and nursing note reviewed.  Constitutional:      Appearance: He is well-developed. He is not ill-appearing or diaphoretic.  HENT:     Head: Normocephalic and atraumatic.     Right Ear: External ear normal.     Left Ear: External ear normal.  Eyes:     General: No scleral icterus.       Right eye: No discharge.        Left eye: No discharge.     Conjunctiva/sclera: Conjunctivae normal.  Neck:  Trachea: No tracheal deviation.  Cardiovascular:     Rate and Rhythm: Regular rhythm. Tachycardia present.  Pulmonary:     Effort: Pulmonary effort is normal. No respiratory distress.     Breath sounds: Normal breath sounds. No stridor. No wheezing or rales.  Abdominal:     General: Bowel sounds are normal. There is no distension.     Palpations: Abdomen is soft.     Tenderness: There is no abdominal tenderness. There is no guarding or rebound.  Musculoskeletal:        General: No tenderness or deformity.     Cervical back: Neck supple.  Skin:    General: Skin is warm and dry.     Findings: No rash.  Neurological:     General: No focal deficit present.     Mental Status: He is alert.     Cranial Nerves: No cranial nerve deficit, dysarthria or facial asymmetry.     Sensory: No sensory deficit.     Motor: No abnormal muscle tone or seizure  activity.     Coordination: Coordination normal.  Psychiatric:        Mood and Affect: Mood normal.     ED Results / Procedures / Treatments   Labs (all labs ordered are listed, but only abnormal results are displayed) Labs Reviewed  CBC - Abnormal; Notable for the following components:      Result Value   WBC 3.4 (*)    Hemoglobin 12.9 (*)    All other components within normal limits  URINALYSIS, ROUTINE W REFLEX MICROSCOPIC - Abnormal; Notable for the following components:   Color, Urine STRAW (*)    Specific Gravity, Urine 1.037 (*)    Glucose, UA >=500 (*)    Ketones, ur 20 (*)    All other components within normal limits  BASIC METABOLIC PANEL - Abnormal; Notable for the following components:   Sodium 131 (*)    Chloride 97 (*)    Glucose, Bld 589 (*)    Calcium 8.8 (*)    All other components within normal limits  BETA-HYDROXYBUTYRIC ACID - Abnormal; Notable for the following components:   Beta-Hydroxybutyric Acid 0.71 (*)    All other components within normal limits  BLOOD GAS, VENOUS - Abnormal; Notable for the following components:   Acid-Base Excess 2.5 (*)    All other components within normal limits  CBG MONITORING, ED - Abnormal; Notable for the following components:   Glucose-Capillary 535 (*)    All other components within normal limits  CBG MONITORING, ED - Abnormal; Notable for the following components:   Glucose-Capillary 474 (*)    All other components within normal limits  I-STAT CHEM 8, ED - Abnormal; Notable for the following components:   Sodium 134 (*)    Chloride 97 (*)    Glucose, Bld 584 (*)    Calcium, Ion 1.14 (*)    Hemoglobin 12.9 (*)    HCT 38.0 (*)    All other components within normal limits  CBG MONITORING, ED - Abnormal; Notable for the following components:   Glucose-Capillary 458 (*)    All other components within normal limits  CBG MONITORING, ED - Abnormal; Notable for the following components:   Glucose-Capillary 400 (*)     All other components within normal limits  CBG MONITORING, ED - Abnormal; Notable for the following components:   Glucose-Capillary 286 (*)    All other components within normal limits    EKG EKG Interpretation Date/Time:  Thursday July 10 2023 14:16:29 EST Ventricular Rate:  109 PR Interval:  131 QRS Duration:  76 QT Interval:  319 QTC Calculation: 430 R Axis:   75  Text Interpretation: Sinus tachycardia Ventricular premature complex Aberrant complex Since last tracing rate slower Confirmed by Linwood Dibbles [phone]) on 07/10/2023 2:21:38 PM  Radiology No results found.  Procedures Procedures    Medications Ordered in ED Medications  insulin regular, human (MYXREDLIN) 100 units/ 100 mL infusion (6 Units/hr Intravenous Rate/Dose Change 07/10/23 1645)  lactated ringers infusion ( Intravenous New Bag/Given 07/10/23 1445)  dextrose 5 % in lactated ringers infusion (has no administration in time range)  dextrose 50 % solution 0-50 mL (has no administration in time range)  insulin glargine (LANTUS) injection 30 Units (has no administration in time range)  lactated ringers bolus 1,500 mL (0 mLs Intravenous Stopped 07/10/23 1440)    ED Course/ Medical Decision Making/ A&P Clinical Course as of 07/10/23 1715  Thu Jul 10, 2023  1331 I-stat chem 8, ed(!!) Blood test show hyperglycemia bicarb normal. [JK]  1335 BC normal. [JK]  1442 Basic metabolic panel(!!) Hyperglycemia noted, no anion gap acidosis [JK]  1522 Beta-hydroxybutyric acid(!) Betty hydroxybutyric acid increased to 0.71 [JK]  1522 Venous blood gas without signs of acidosis [JK]  1631 CBG monitoring, ED(!) Blood sugar improving down to 400 [JK]  1713 Care turned over to Dr Dalene Seltzer [JK]    Clinical Course User Index [JK] Linwood Dibbles, MD                                 Medical Decision Making Amount and/or Complexity of Data Reviewed Labs: ordered. Decision-making details documented in ED  Course.  Risk Prescription drug management.   Patient presented to the ED for evaluation of hyperglycemia.  Fortunately laboratory test are not suggestive of DKA.  Patient unfortunately ran out of his Levemir.  The pharmacist instructed him that the pharmaceutical company was no longer making that drug.  Patient contacted his doctor about getting a replacement for the Levemir.  He had not received a prescription yet.  Patient was noting persistent hyperglycemia so he came to the ED.Marland Kitchen  Patient was treated with IV fluids as well as IV insulin.  His blood sugars improved.  Now down to 286.  Will give him a dose of his long-acting insulin while he is here.  I will give him a prescription for for Tresiba.  Case discussed with pharmacy for their assistance        Final Clinical Impression(s) / ED Diagnoses Final diagnoses:  Hyperglycemia due to type 1 diabetes mellitus (HCC)    Rx / DC Orders ED Discharge Orders          Ordered    insulin degludec (TRESIBA) 100 UNIT/ML FlexTouch Pen  Daily        07/10/23 1648              Linwood Dibbles, MD 07/10/23 1715

## 2023-07-10 NOTE — ED Notes (Addendum)
 Mark Fry

## 2023-07-10 NOTE — ED Triage Notes (Signed)
 Diabetic, has been out of levemir for 2 weeks. Pt is taking is short acting insulin. Last use last night. BS was in the 400s at home. C/o increased urination.

## 2023-07-28 ENCOUNTER — Ambulatory Visit: Payer: Federal, State, Local not specified - PPO | Admitting: Family Medicine

## 2023-07-28 ENCOUNTER — Telehealth: Payer: Self-pay | Admitting: *Deleted

## 2023-07-28 NOTE — Telephone Encounter (Signed)
 Tried calling patient per pcp request, left message to give me call back. Per pcp: -please call him-I'm concerned about his DM, u creat being off, and not coming in for appts(he is on the creat list)

## 2023-09-15 ENCOUNTER — Telehealth: Payer: Self-pay

## 2023-09-15 NOTE — Telephone Encounter (Signed)
 Patient was identified as falling into the [place] Measure - Diabetes.   Patient was: Patient is not currently using our practice.

## 2023-12-25 IMAGING — CT CT ABD-PELV W/O CM
2 of 4 series · 16 of 46 positions shown, 18 images · non-contrast
Comparison: 05/12/2015

CLINICAL DATA: Bowel obstruction

EXAM:
CT ABDOMEN AND PELVIS WITHOUT CONTRAST
TECHNIQUE: Multidetector CT imaging of the abdomen and pelvis was performed
following the standard protocol without IV contrast.

[Series 2: axial st · axial · 0.74mm/px · z∈[-277,+103]mm · 13 of 84 slices shown, 15 images]
[im 4/84  soft-tissue]
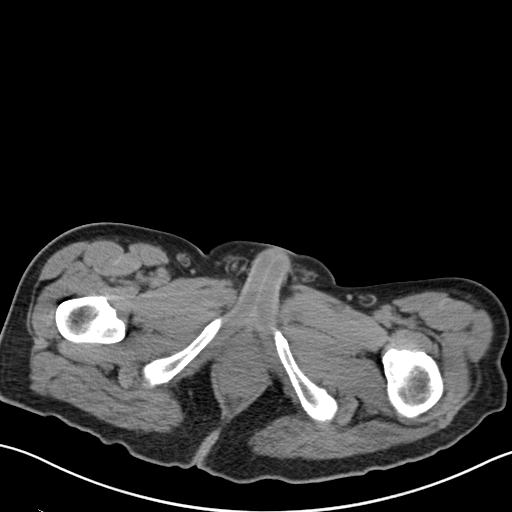
[im 4/84  bone]
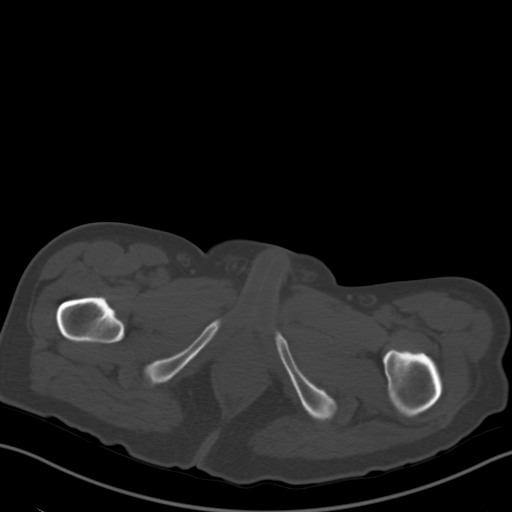
[im 11/84  soft-tissue]
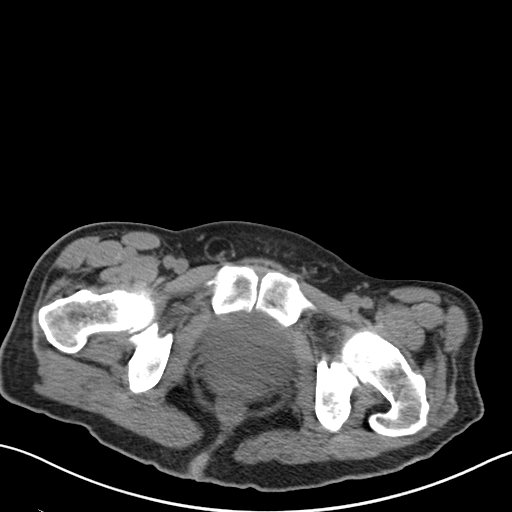
[im 18/84  soft-tissue]
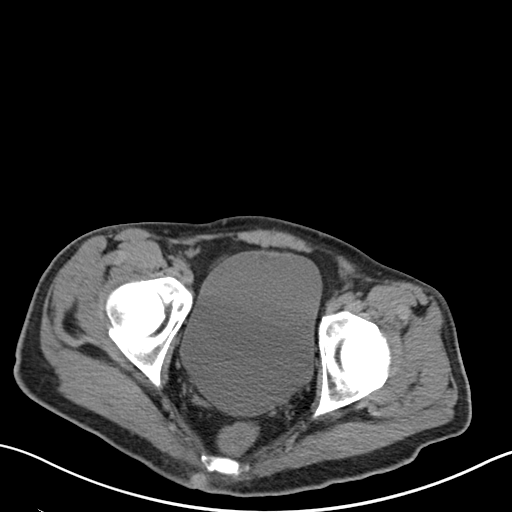
[im 25/84  soft-tissue]
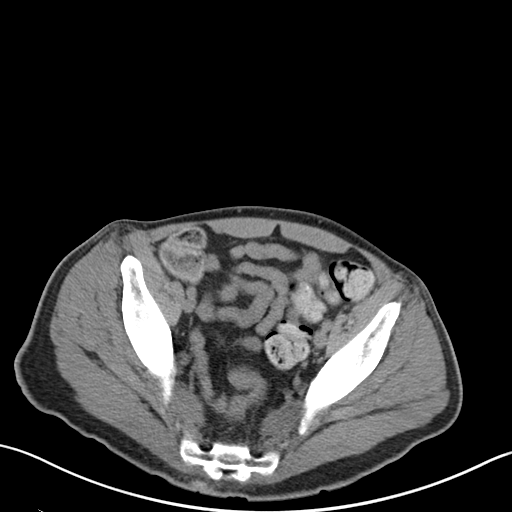
[im 28/84  soft-tissue]
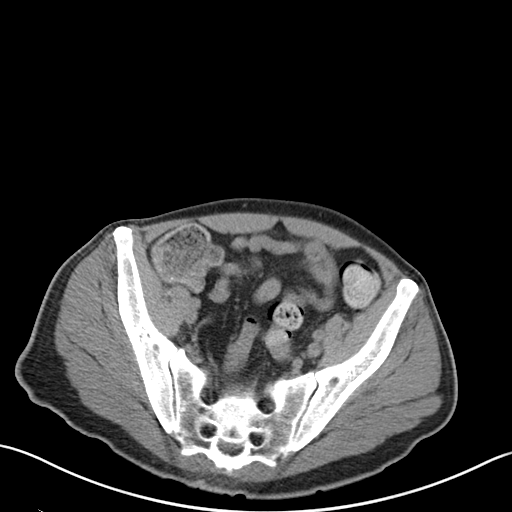
[im 35/84  soft-tissue]
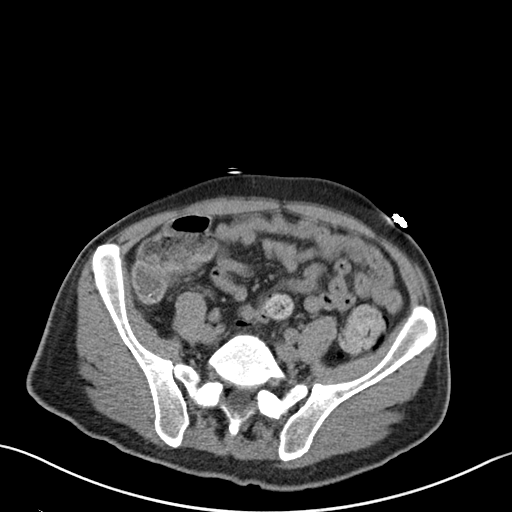
[im 42/84  soft-tissue]
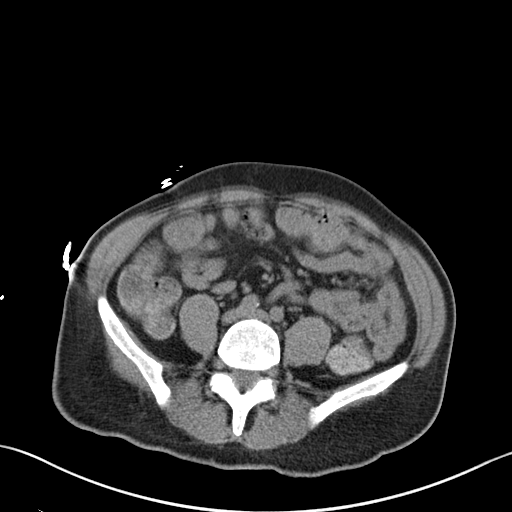
[im 49/84  soft-tissue]
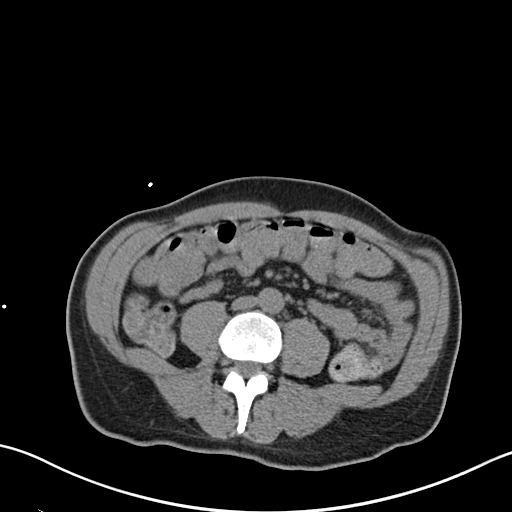
[im 56/84  soft-tissue]
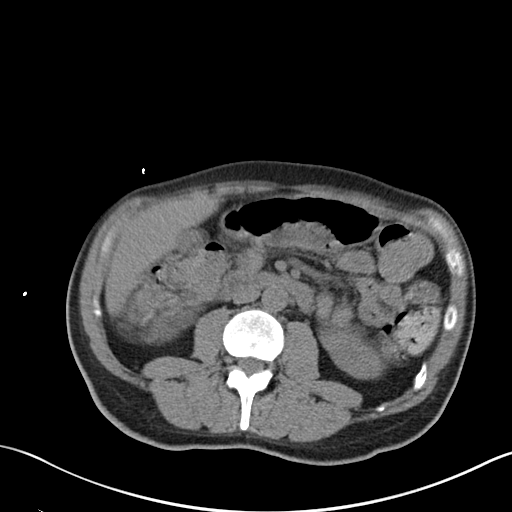
[im 56/84  bone]
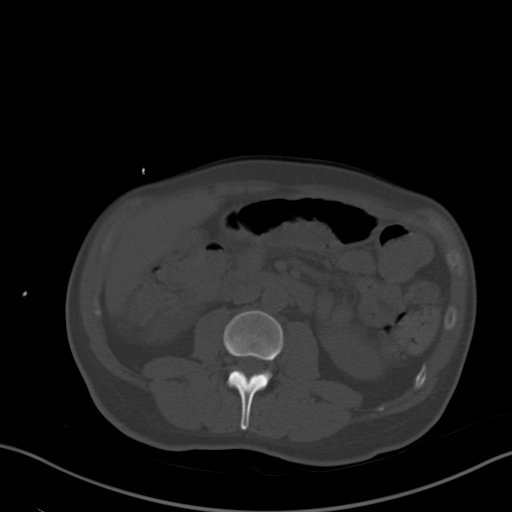
[im 59/84  soft-tissue]
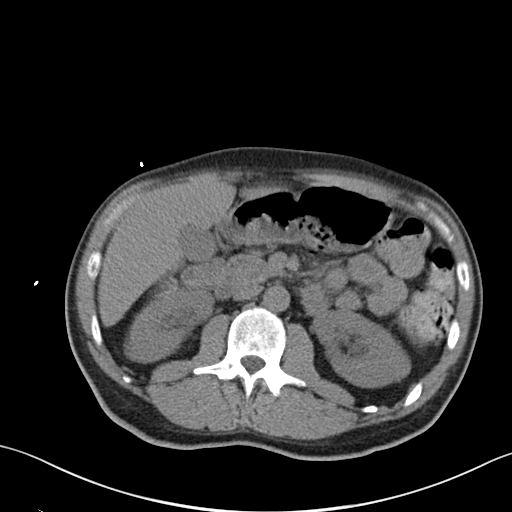
[im 66/84  soft-tissue]
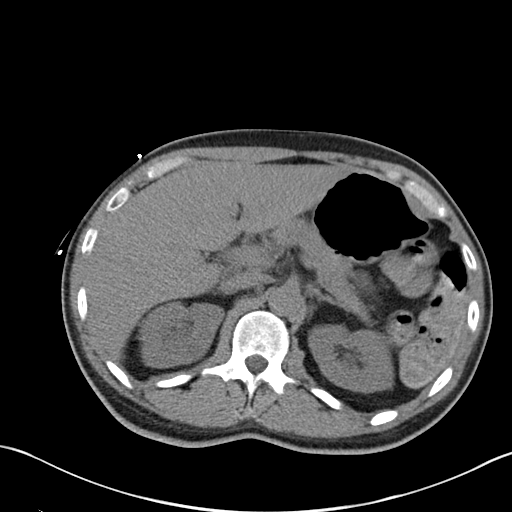
[im 73/84  soft-tissue]
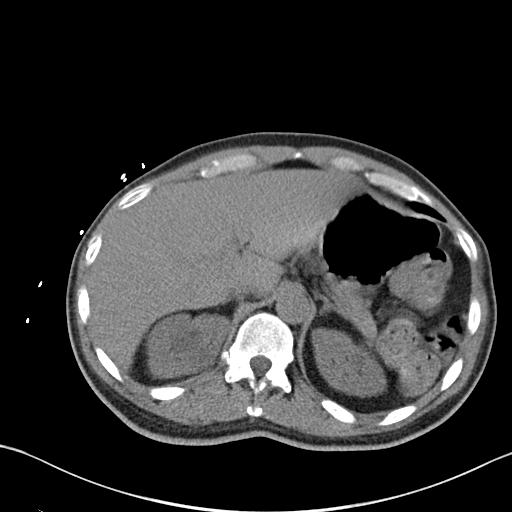
[im 80/84  soft-tissue]
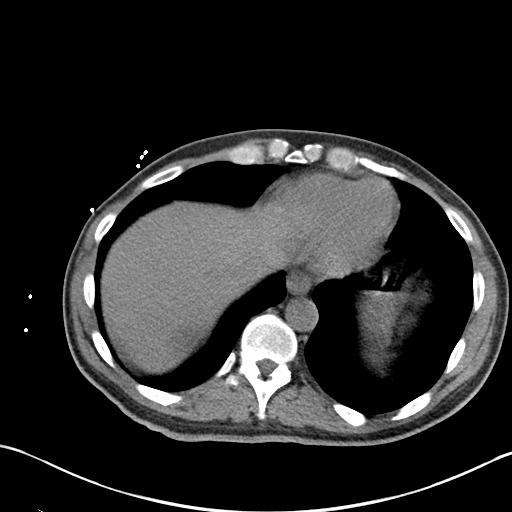

[Series 4: coronal st · coronal · 0.63mm/px · 3 of 137 slices shown]
[im 46/137  soft-tissue]
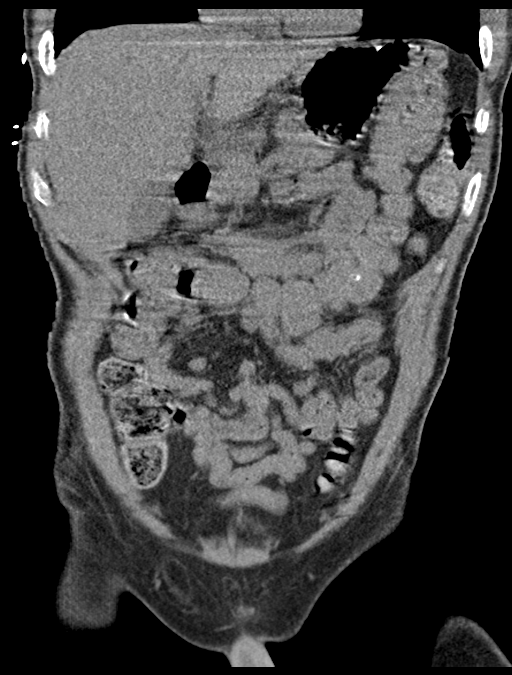
[im 61/137  soft-tissue]
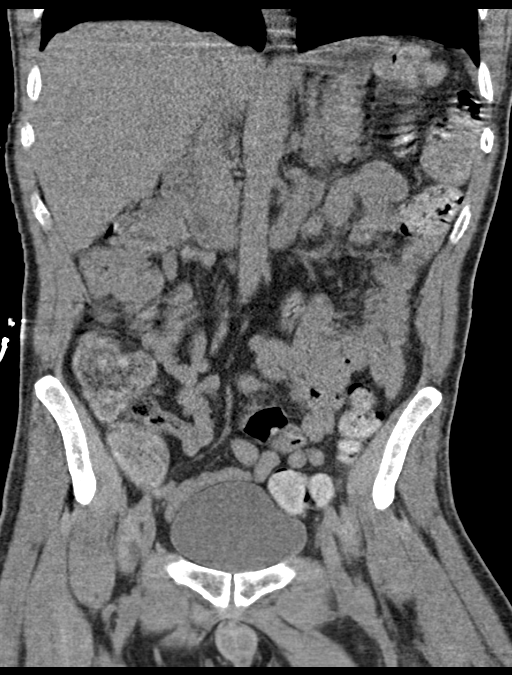
[im 76/137  soft-tissue]
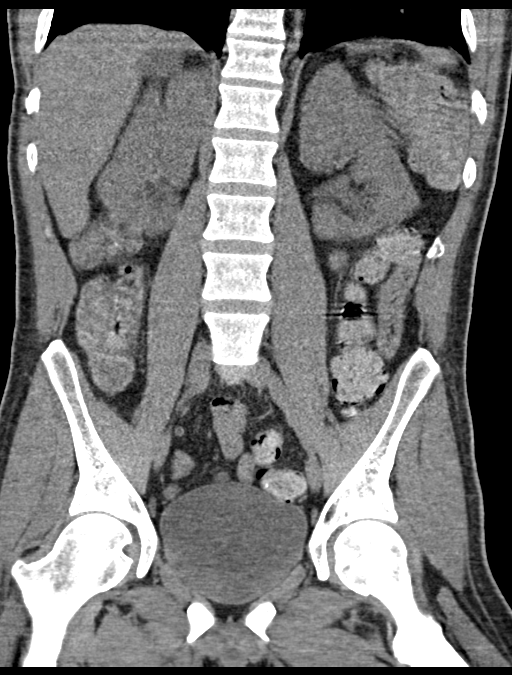

[16 of 46 positions shown; findings below may reference images not displayed]

FINDINGS: Lower chest: No acute abnormality.

Hepatobiliary: No focal liver abnormality is seen. No gallstones,
gallbladder wall thickening, or biliary dilatation.

Pancreas: Unremarkable

Spleen: Unremarkable

Adrenals/Urinary Tract: Adrenal glands are unremarkable. Kidneys are
normal in size and position. Multiple simple cortical cyst again
noted within the upper pole the right kidney. The kidneys are
otherwise unremarkable. Bladder unremarkable.

Stomach/Bowel: Moderate stool seen throughout the colon without
evidence of obstruction. Stomach, small bowel, and large bowel are
otherwise unremarkable. Appendix normal. No free intraperitoneal gas
or fluid.

Vascular/Lymphatic: Minimal aortoiliac atherosclerotic
calcification. Abdominal vasculature is otherwise unremarkable on
this noncontrast examination. No pathologic adenopathy within the
abdomen and pelvis.

Reproductive: Prostate is unremarkable.

Other: No abdominal wall hernia.

Musculoskeletal: No acute or significant osseous findings.
IMPRESSION: No acute intra-abdominal pathology identified. No definite
radiographic explanation for the patient's symptoms.

Moderate stool burden

Aortic Atherosclerosis ([id number]).
